# Patient Record
Sex: Female | Born: 1985 | Race: White | Hispanic: No | Marital: Married | Smoking: Never smoker
Health system: Southern US, Community
[De-identification: ages and names within clinical notes are randomized; demographics above are authoritative.]

## PROBLEM LIST (undated history)

## (undated) ENCOUNTER — Inpatient Hospital Stay (HOSPITAL_COMMUNITY): Payer: Self-pay

## (undated) DIAGNOSIS — F32A Depression, unspecified: Secondary | ICD-10-CM

## (undated) DIAGNOSIS — O2340 Unspecified infection of urinary tract in pregnancy, unspecified trimester: Secondary | ICD-10-CM

## (undated) DIAGNOSIS — F429 Obsessive-compulsive disorder, unspecified: Secondary | ICD-10-CM

## (undated) DIAGNOSIS — K5190 Ulcerative colitis, unspecified, without complications: Secondary | ICD-10-CM

## (undated) DIAGNOSIS — R053 Chronic cough: Secondary | ICD-10-CM

## (undated) DIAGNOSIS — I493 Ventricular premature depolarization: Secondary | ICD-10-CM

## (undated) DIAGNOSIS — F419 Anxiety disorder, unspecified: Secondary | ICD-10-CM

## (undated) DIAGNOSIS — I10 Essential (primary) hypertension: Secondary | ICD-10-CM

## (undated) DIAGNOSIS — F329 Major depressive disorder, single episode, unspecified: Secondary | ICD-10-CM

## (undated) DIAGNOSIS — K589 Irritable bowel syndrome without diarrhea: Secondary | ICD-10-CM

## (undated) DIAGNOSIS — M1990 Unspecified osteoarthritis, unspecified site: Secondary | ICD-10-CM

## (undated) DIAGNOSIS — R05 Cough: Secondary | ICD-10-CM

## (undated) HISTORY — PX: WISDOM TOOTH EXTRACTION: SHX21

---
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
## 2011-07-23 LAB — OB RESULTS CONSOLE PLATELET COUNT: Platelets: 269 10*3/uL

## 2011-07-23 LAB — OB RESULTS CONSOLE HIV ANTIBODY (ROUTINE TESTING): HIV: NONREACTIVE

## 2011-07-23 LAB — OB RESULTS CONSOLE HGB/HCT, BLOOD: Hemoglobin: 13.8 g/dL

## 2011-07-23 LAB — OB RESULTS CONSOLE RUBELLA ANTIBODY, IGM: Rubella: IMMUNE

## 2011-07-23 LAB — OB RESULTS CONSOLE GC/CHLAMYDIA
Chlamydia: NEGATIVE
Gonorrhea: NEGATIVE

## 2011-10-27 ENCOUNTER — Encounter (HOSPITAL_COMMUNITY): Payer: Self-pay | Admitting: *Deleted

## 2011-10-27 ENCOUNTER — Inpatient Hospital Stay (HOSPITAL_COMMUNITY)
Admission: AD | Admit: 2011-10-27 | Discharge: 2011-10-27 | Disposition: A | Discharge status: Hospice | Payer: 59 | Source: Ambulatory Visit | Attending: Obstetrics and Gynecology | Admitting: Obstetrics and Gynecology

## 2011-10-27 DIAGNOSIS — O26899 Other specified pregnancy related conditions, unspecified trimester: Secondary | ICD-10-CM

## 2011-10-27 DIAGNOSIS — R109 Unspecified abdominal pain: Secondary | ICD-10-CM | POA: Insufficient documentation

## 2011-10-27 DIAGNOSIS — O99891 Other specified diseases and conditions complicating pregnancy: Secondary | ICD-10-CM | POA: Insufficient documentation

## 2011-10-27 HISTORY — DX: Depression, unspecified: F32.A

## 2011-10-27 HISTORY — DX: Anxiety disorder, unspecified: F41.9

## 2011-10-27 HISTORY — DX: Unspecified osteoarthritis, unspecified site: M19.90

## 2011-10-27 HISTORY — DX: Unspecified infection of urinary tract in pregnancy, unspecified trimester: O23.40

## 2011-10-27 HISTORY — DX: Major depressive disorder, single episode, unspecified: F32.9

## 2011-10-27 HISTORY — DX: Irritable bowel syndrome, unspecified: K58.9

## 2011-10-27 LAB — URINALYSIS, ROUTINE W REFLEX MICROSCOPIC
Glucose, UA: NEGATIVE mg/dL
Ketones, ur: NEGATIVE mg/dL
Leukocytes, UA: NEGATIVE
pH: 7 (ref 5.0–8.0)

## 2011-10-27 NOTE — Discharge Instructions (Signed)
Abdominal Pain During Pregnancy Belly (abdominal) pain is common during pregnancy. Most of the time, it is not a serious problem. Other times, it can be a sign that something is wrong with the pregnancy. Always tell your doctor if you have belly pain. HOME CARE For mild pain:  Do not have sex (intercourse) or put anything in your vagina until you feel better.   Rest until your pain stops. If your pain lasts longer than 1 hour, call your doctor.   Drink clear fluids if you feel sick to your stomach (nauseous).   Do not eat solid food until you feel better.   Only take medicine as told by your doctor.   Keep all doctor visits as told.  GET HELP RIGHT AWAY IF:   You are bleeding, leaking fluid, or pieces of tissue come out of your vagina.   You have more pain or cramping.   You keep throwing up (vomiting).   You have pain when you pee (urinate) or have blood in your pee.   You have a fever.   You do not feel your baby moving as much.   You feel very weak or feel like passing out.   You have trouble breathing, with or without belly pain.   You have a very bad headache and belly pain.   You have fluid leaking from your vagina and belly pain.   You keep having watery poop (diarrhea).   Your belly pain does not go away after resting, or the pain gets worse.  MAKE SURE YOU:   Understand these instructions.   Will watch your condition.   Will get help right away if you are not doing well or get worse.  Document Released: 01/22/2009 Document Revised: 01/23/2011 Document Reviewed: 08/30/2010 [facility] Patient Information 2012 [place].

## 2011-10-27 NOTE — MAU Provider Note (Signed)
History     CSN: [id number]  Arrival date and time: 10/27/11 1542   First Provider Initiated Contact with Patient 10/27/11 1651      Chief Complaint  Patient presents with  . Abdominal Pain   HPI Samoria Fedorko is 26 y.o. G2P0010 59w4dweeks presenting with cramping.  States she has had cramping the entire pregnancy but now it is constant.  Patient of Dr. KMignon Pine  Points to the lower abdomen as if she is going to start her period.  Denies vaginal bleeding.  Has UTI, dx 3 days ago.  Taking antibiotic for 2 days.      Past Medical History  Diagnosis Date  . Anxiety   . Depression   . Irritable bowel syndrome (IBS)   . UTI (urinary tract infection) during pregnancy   . Arthritis     Past Surgical History  Procedure Date  . Wisdom tooth extraction     Family History  Problem Relation Age of Onset  . Hypertension Mother   . Hyperlipidemia Mother   . Arthritis Maternal Grandmother   . Hyperlipidemia Maternal Grandmother   . Hypertension Maternal Grandmother     History  Substance Use Topics  . Smoking status: Never Smoker   . Smokeless tobacco: Not on file  . Alcohol Use: Yes     prior to pregnancy    Allergies:  Allergies  Allergen Reactions  . Benadryl (Diphenhydramine Hcl) Rash    Childhood allergy  . Sulfa Antibiotics Rash    Childhood allergy    Prescriptions prior to admission  Medication Sig Dispense Refill  . acetaminophen (TYLENOL) 500 MG tablet Take 500 mg by mouth every 6 (six) hours as needed. For pain      . Docosahexaenoic Acid (DHA OMEGA 3 PO) Take 1 capsule by mouth daily.      .Marland Kitchenloperamide (IMODIUM) 2 MG capsule Take 2 mg by mouth 2 (two) times daily as needed. For diarrhea or upset stomach      . nitrofurantoin, macrocrystal-monohydrate, (MACROBID) 100 MG capsule Take 100 mg by mouth 2 (two) times daily.      . ondansetron (ZOFRAN) 8 MG tablet Take 8 mg by mouth every 8 (eight) hours as needed. For nausea or vomiting      . Prenatal Vit-Fe  Fumarate-FA (PRENATAL MULTIVITAMIN) TABS Take 1 tablet by mouth daily.      . sertraline (ZOLOFT) 50 MG tablet Take 50 mg by mouth daily.        Review of Systems  Constitutional: Negative.   HENT: Negative.   Respiratory: Negative.   Cardiovascular: Negative.   Gastrointestinal: Positive for abdominal pain (cramping).  Genitourinary: Negative.        Neg for vaginal bleeding   Physical Exam   Blood pressure 116/69, pulse 105, temperature 98.6 F (37 C), temperature source Oral, resp. rate 20, height 5' 7"  (1.702 m), weight 64.411 kg (142 lb).  Physical Exam  Constitutional: She is oriented to person, place, and time. She appears well-developed and well-nourished. No distress.  HENT:  Head: Normocephalic.  Respiratory: Effort normal.  GI: There is no tenderness.  Genitourinary: Uterus is enlarged. Uterus is not tender. No tenderness or bleeding around the vagina. No vaginal discharge found.       Cervix closed posterior.   Neurological: She is alert and oriented to person, place, and time.  Skin: Skin is warm and dry.  Psychiatric: She has a normal mood and affect. Her behavior is normal.  Results for orders placed during the hospital encounter of 10/27/11 (from the past 24 hour(s))  URINALYSIS, ROUTINE W REFLEX MICROSCOPIC     Status: Abnormal   Collection Time   10/27/11  5:30 PM      Component Value Range   Color, Urine YELLOW  YELLOW   APPearance CLEAR  CLEAR   Specific Gravity, Urine <1.005 (*) 1.005 - 1.030   pH 7.0  5.0 - 8.0   Glucose, UA NEGATIVE  NEGATIVE mg/dL   Hgb urine dipstick NEGATIVE  NEGATIVE   Bilirubin Urine NEGATIVE  NEGATIVE   Ketones, ur NEGATIVE  NEGATIVE mg/dL   Protein, ur NEGATIVE  NEGATIVE mg/dL   Urobilinogen, UA 0.2  0.0 - 1.0 mg/dL   Nitrite NEGATIVE  NEGATIVE   Leukocytes, UA NEGATIVE  NEGATIVE    MAU Course  Procedures  MDM 18:10  Reported MSE, check cervix, if closed discharge to home and use tylenol for discomfort.  Assessment  and Plan  A:  Abdominal pain at 93w4dgestation     Neg UA     Cervix is closed  P:  Reassured       May take tylenol for discomfort    Keep scheduled appointment for continued prenatal care     Call your doctor if sxs worsen  Charisma Charlot,EVE M 10/27/2011, 4:54 PM

## 2011-10-27 NOTE — MAU Note (Signed)
Has a UTI , is on medication for that-started on Fri.  Has been cramping all day, mainly low abd some in low back. cramping has gotten more frequent.

## 2012-01-01 ENCOUNTER — Observation Stay (HOSPITAL_COMMUNITY)
Admission: AD | Admit: 2012-01-01 | Discharge: 2012-01-02 | Disposition: A | Discharge status: Home / Self Care | Payer: 59 | Source: Ambulatory Visit | Attending: Obstetrics and Gynecology | Admitting: Obstetrics and Gynecology

## 2012-01-01 ENCOUNTER — Encounter (HOSPITAL_COMMUNITY): Payer: Self-pay | Admitting: *Deleted

## 2012-01-01 DIAGNOSIS — R51 Headache: Secondary | ICD-10-CM | POA: Insufficient documentation

## 2012-01-01 DIAGNOSIS — O4700 False labor before 37 completed weeks of gestation, unspecified trimester: Principal | ICD-10-CM | POA: Insufficient documentation

## 2012-01-01 DIAGNOSIS — O368390 Maternal care for abnormalities of the fetal heart rate or rhythm, unspecified trimester, not applicable or unspecified: Secondary | ICD-10-CM | POA: Insufficient documentation

## 2012-01-01 LAB — URINALYSIS, ROUTINE W REFLEX MICROSCOPIC
Leukocytes, UA: NEGATIVE
Nitrite: NEGATIVE
Specific Gravity, Urine: <1.005 — ABNORMAL LOW (ref 1.005–1.030)
pH: 6 (ref 5.0–8.0)

## 2012-01-01 LAB — AMNISURE RUPTURE OF MEMBRANE (ROM) NOT AT ARMC: Amnisure ROM: NEGATIVE

## 2012-01-01 LAB — WET PREP, GENITAL
Trich, Wet Prep: NONE SEEN
Yeast Wet Prep HPF POC: NONE SEEN

## 2012-01-01 NOTE — Progress Notes (Signed)
Pt believe's her headache was since related

## 2012-01-01 NOTE — MAU Note (Signed)
Pt states she has been having contractions "as many as 4 in 1 hour" since Saturday.

## 2012-01-01 NOTE — MAU Provider Note (Signed)
History     CSN: [id number]  Arrival date and time: 01/01/12 2123   First Provider Initiated Contact with Patient 01/01/12 2353      Chief Complaint  Patient presents with  . Contractions   HPI  Gabriela Moses is a 26 y.o. G2P0010 who presents today with frequent braxton hicks contractions. She has had them yesterday and today. She is also concerned that she may be leaking fluid.   Past Medical History  Diagnosis Date  . Anxiety   . Depression   . Irritable bowel syndrome (IBS)   . UTI (urinary tract infection) during pregnancy   . Arthritis     Past Surgical History  Procedure Date  . Wisdom tooth extraction     Family History  Problem Relation Age of Onset  . Hypertension Mother   . Hyperlipidemia Mother   . Arthritis Maternal Grandmother   . Hyperlipidemia Maternal Grandmother   . Hypertension Maternal Grandmother     History  Substance Use Topics  . Smoking status: Never Smoker   . Smokeless tobacco: Not on file  . Alcohol Use: Yes     Comment: prior to pregnancy    Allergies:  Allergies  Allergen Reactions  . Benadryl (Diphenhydramine Hcl) Rash    Childhood allergy  . Sulfa Antibiotics Rash    Childhood allergy    Prescriptions prior to admission  Medication Sig Dispense Refill  . acetaminophen (TYLENOL) 500 MG tablet Take 500 mg by mouth every 6 (six) hours as needed. For pain      . calcium carbonate (TUMS - DOSED IN MG ELEMENTAL CALCIUM) 500 MG chewable tablet Chew 2 tablets by mouth daily as needed. For heartburn      . fish oil-omega-3 fatty acids 1000 MG capsule Take 1 g by mouth daily.      . ondansetron (ZOFRAN) 8 MG tablet Take 8 mg by mouth daily as needed. For nausea or vomiting      . Prenatal Vit-Fe Fumarate-FA (PRENATAL MULTIVITAMIN) TABS Take 1 tablet by mouth daily.      . sodium chloride (OCEAN) 0.65 % nasal spray Place 1 spray into the nose daily as needed. For dryness/congestion        ROS Physical Exam   Blood pressure  131/71, pulse 105, temperature 98.5 F (36.9 C), temperature source Oral, resp. rate 18, height 5' 7"  (1.702 m), weight 69.037 kg (152 lb 3.2 oz), SpO2 100.00%.  Physical Exam  Nursing note and vitals reviewed. Constitutional: She is oriented to person, place, and time. She appears well-developed and well-nourished.  Cardiovascular: Normal rate.   Respiratory: Effort normal.  GI: Soft. She exhibits no distension. There is no tenderness.  Genitourinary:        External: normal Cervix: 1/70/0/posterior  Neurological: She is alert and oriented to person, place, and time.  Skin: Skin is warm and dry.    MAU Course  Procedures  Results for orders placed during the hospital encounter of 01/01/12 (from the past 24 hour(s))  URINALYSIS, ROUTINE W REFLEX MICROSCOPIC     Status: Abnormal   Collection Time   01/01/12  9:27 PM      Component Value Range   Color, Urine YELLOW  YELLOW   APPearance CLEAR  CLEAR   Specific Gravity, Urine <1.005 (*) 1.005 - 1.030   pH 6.0  5.0 - 8.0   Glucose, UA NEGATIVE  NEGATIVE mg/dL   Hgb urine dipstick NEGATIVE  NEGATIVE   Bilirubin Urine NEGATIVE  NEGATIVE  Ketones, ur NEGATIVE  NEGATIVE mg/dL   Protein, ur NEGATIVE  NEGATIVE mg/dL   Urobilinogen, UA 0.2  0.0 - 1.0 mg/dL   Nitrite NEGATIVE  NEGATIVE   Leukocytes, UA NEGATIVE  NEGATIVE  AMNISURE RUPTURE OF MEMBRANE (ROM)     Status: Normal   Collection Time   01/01/12 10:50 PM      Component Value Range   Amnisure ROM NEGATIVE    WET PREP, GENITAL     Status: Abnormal   Collection Time   01/01/12 10:50 PM      Component Value Range   Yeast Wet Prep HPF POC NONE SEEN  NONE SEEN   Trich, Wet Prep NONE SEEN  NONE SEEN   Clue Cells Wet Prep HPF POC FEW (*) NONE SEEN   WBC, Wet Prep HPF POC FEW (*) NONE SEEN  FETAL FIBRONECTIN     Status: Normal   Collection Time   01/01/12 10:50 PM      Component Value Range   Fetal Fibronectin NEGATIVE  NEGATIVE    *RADIOLOGY REPORT*  Clinical Data:  Variables. Nonreactive NST.  LIMITED OBSTETRIC ULTRASOUND  Number of Fetuses: 1  Heart Rate: 142 bpm  Movement: Present  Presentation: Cephalic  Placental Location: Posterior  Previa: Absent  Amniotic Fluid (Subjective): Normal  AFI: 12.1 cm (5%ile 9.5 cm, 95%ile 22.6 cm)  FL: 5.53cm 29w 2d EDC: 03/17/2012  MATERNAL FINDINGS:  Cervix: 3.8 cm, closed.  Uterus/Adnexae: Ovaries not visualized.  BPP:  Movement: 2 Time: 10 minutes  Breathing: No  Tone: No  Amniotic Fluid: No  Total Score: 8/8  IMPRESSION:  As above.  Original Report Authenticated By: Rolm Baptise, M.D.   0005Damaris Schooner with Dr. Philis Pique, reviewed patients strip, and SVE results. Plan to send patient for BPP. Start IV, bolus with LR, Procardia.  0143: Repeat SVE: 1/50/-1 0150: Spoke with Dr. Philis Pique. Plan to have patient stay on antenatal for tonight, and re-evaluate in the morning.  Assessment and Plan  Preterm contractions Admit to antenatal Procardia for tocolysis Continuous Fetal monitoring  Mathis Bud 01/01/2012, 11:54 PM

## 2012-01-01 NOTE — MAU Note (Signed)
Braxton hicks since Saturday. 1-3 contractions per hour. Denies vaginal bleeding. Positive fetal movement. Clear watery discharge "for a while" that has been checked on in the office.

## 2012-01-02 ENCOUNTER — Encounter (HOSPITAL_COMMUNITY): Payer: Self-pay | Admitting: *Deleted

## 2012-01-02 ENCOUNTER — Inpatient Hospital Stay (HOSPITAL_COMMUNITY): Payer: 59

## 2012-01-02 MED ORDER — CALCIUM CARBONATE ANTACID 500 MG PO CHEW: 2.0000 | CHEWABLE_TABLET | ORAL | Status: DC | PRN

## 2012-01-02 MED ORDER — NIFEDIPINE 10 MG PO CAPS: 10.0000 mg | ORAL_CAPSULE | Freq: Four times a day (QID) | ORAL | Status: DC

## 2012-01-02 MED ORDER — PRENATAL MULTIVITAMIN CH
1.0000 | ORAL_TABLET | Freq: Every day | ORAL | Status: DC
  Administered 2012-01-02: 1 via ORAL
  Filled 2012-01-02: qty 1

## 2012-01-02 MED ORDER — ACETAMINOPHEN 325 MG PO TABS
650.0000 mg | ORAL_TABLET | ORAL | Status: DC | PRN
  Administered 2012-01-02: 650 mg via ORAL
  Filled 2012-01-02: qty 2

## 2012-01-02 MED ORDER — NIFEDIPINE 10 MG PO CAPS: 20.0000 mg | ORAL_CAPSULE | Freq: Once | ORAL | Status: DC

## 2012-01-02 MED ORDER — LACTATED RINGERS IV BOLUS (SEPSIS)
1000.0000 mL | Freq: Once | INTRAVENOUS | Status: AC
  Administered 2012-01-02: 1000 mL via INTRAVENOUS

## 2012-01-02 MED ORDER — NIFEDIPINE 10 MG PO CAPS
20.0000 mg | ORAL_CAPSULE | Freq: Once | ORAL | Status: AC
  Administered 2012-01-02: 20 mg via ORAL
  Filled 2012-01-02: qty 1

## 2012-01-02 MED ORDER — NIFEDIPINE 10 MG PO CAPS
20.0000 mg | ORAL_CAPSULE | Freq: Once | ORAL | Status: AC
  Administered 2012-01-02: 20 mg via ORAL
  Filled 2012-01-02: qty 2

## 2012-01-02 MED ORDER — LACTATED RINGERS IV SOLN
INTRAVENOUS | Status: DC
  Administered 2012-01-02: 02:00:00 via INTRAVENOUS

## 2012-01-02 MED ORDER — NIFEDIPINE 10 MG PO CAPS
10.0000 mg | ORAL_CAPSULE | Freq: Four times a day (QID) | ORAL | Status: DC
  Administered 2012-01-02: 10 mg via ORAL
  Filled 2012-01-02: qty 1

## 2012-01-02 NOTE — Progress Notes (Signed)
UR Chart review completed.  

## 2012-01-02 NOTE — H&P (Signed)
Arrival date and time: 01/01/12 2123   First Provider Initiated Contact with Patient 01/01/12 2353      Chief Complaint  Patient presents with  . Contractions   HPI  Gabriela Moses is a 26 y.o. G2P0010 who presents today with frequent braxton hicks contractions. She has had them yesterday and today. She is also concerned that she may be leaking fluid.   Past Medical History  Diagnosis Date  . Anxiety   . Depression   . Irritable bowel syndrome (IBS)   . UTI (urinary tract infection) during pregnancy   . Arthritis     Past Surgical History  Procedure Date  . Wisdom tooth extraction     Family History  Problem Relation Age of Onset  . Hypertension Mother   . Hyperlipidemia Mother   . Arthritis Maternal Grandmother   . Hyperlipidemia Maternal Grandmother   . Hypertension Maternal Grandmother     History  Substance Use Topics  . Smoking status: Never Smoker   . Smokeless tobacco: Not on file  . Alcohol Use: Yes     Comment: prior to pregnancy    Allergies:  Allergies  Allergen Reactions  . Benadryl (Diphenhydramine Hcl) Rash    Childhood allergy  . Sulfa Antibiotics Rash    Childhood allergy    Prescriptions prior to admission  Medication Sig Dispense Refill  . acetaminophen (TYLENOL) 500 MG tablet Take 500 mg by mouth every 6 (six) hours as needed. For pain      . calcium carbonate (TUMS - DOSED IN MG ELEMENTAL CALCIUM) 500 MG chewable tablet Chew 2 tablets by mouth daily as needed. For heartburn      . fish oil-omega-3 fatty acids 1000 MG capsule Take 1 g by mouth daily.      . ondansetron (ZOFRAN) 8 MG tablet Take 8 mg by mouth daily as needed. For nausea or vomiting      . Prenatal Vit-Fe Fumarate-FA (PRENATAL MULTIVITAMIN) TABS Take 1 tablet by mouth daily.      . sodium chloride (OCEAN) 0.65 % nasal spray Place 1 spray into the nose daily as needed. For dryness/congestion        ROS Physical Exam   Blood pressure 131/71, pulse 105, temperature  98.5 F (36.9 C), temperature source Oral, resp. rate 18, height 5' 7"  (1.702 m), weight 69.037 kg (152 lb 3.2 oz), SpO2 100.00%.  Physical Exam  Nursing note and vitals reviewed. Constitutional: She is oriented to person, place, and time. She appears well-developed and well-nourished.  Cardiovascular: Normal rate.   Respiratory: Effort normal.  GI: Soft. She exhibits no distension. There is no tenderness.  Genitourinary:        External: normal Cervix: 1/70/0/posterior  Neurological: She is alert and oriented to person, place, and time.  Skin: Skin is warm and dry.    MAU Course  Procedures  Results for orders placed during the hospital encounter of 01/01/12 (from the past 24 hour(s))  URINALYSIS, ROUTINE W REFLEX MICROSCOPIC     Status: Abnormal   Collection Time   01/01/12  9:27 PM      Component Value Range   Color, Urine YELLOW  YELLOW   APPearance CLEAR  CLEAR   Specific Gravity, Urine <1.005 (*) 1.005 - 1.030   pH 6.0  5.0 - 8.0   Glucose, UA NEGATIVE  NEGATIVE mg/dL   Hgb urine dipstick NEGATIVE  NEGATIVE   Bilirubin Urine NEGATIVE  NEGATIVE   Ketones, ur NEGATIVE  NEGATIVE mg/dL  Protein, ur NEGATIVE  NEGATIVE mg/dL   Urobilinogen, UA 0.2  0.0 - 1.0 mg/dL   Nitrite NEGATIVE  NEGATIVE   Leukocytes, UA NEGATIVE  NEGATIVE  AMNISURE RUPTURE OF MEMBRANE (ROM)     Status: Normal   Collection Time   01/01/12 10:50 PM      Component Value Range   Amnisure ROM NEGATIVE    WET PREP, GENITAL     Status: Abnormal   Collection Time   01/01/12 10:50 PM      Component Value Range   Yeast Wet Prep HPF POC NONE SEEN  NONE SEEN   Trich, Wet Prep NONE SEEN  NONE SEEN   Clue Cells Wet Prep HPF POC FEW (*) NONE SEEN   WBC, Wet Prep HPF POC FEW (*) NONE SEEN  FETAL FIBRONECTIN     Status: Normal   Collection Time   01/01/12 10:50 PM      Component Value Range   Fetal Fibronectin NEGATIVE  NEGATIVE    *RADIOLOGY REPORT*  Clinical Data: Variables. Nonreactive NST.    LIMITED OBSTETRIC ULTRASOUND  Number of Fetuses: 1  Heart Rate: 142 bpm  Movement: Present  Presentation: Cephalic  Placental Location: Posterior  Previa: Absent  Amniotic Fluid (Subjective): Normal  AFI: 12.1 cm (5%ile 9.5 cm, 95%ile 22.6 cm)  FL: 5.53cm 29w 2d EDC: 03/17/2012  MATERNAL FINDINGS:  Cervix: 3.8 cm, closed.  Uterus/Adnexae: Ovaries not visualized.  BPP:  Movement: 2 Time: 10 minutes  Breathing: No  Tone: No  Amniotic Fluid: No  Total Score: 8/8  IMPRESSION:  As above.  Original Report Authenticated By: Rolm Baptise, M.D.   0005Damaris Schooner with Dr. Philis Pique, reviewed patients strip, and SVE results. Plan to send patient for BPP. Start IV, bolus with LR, Procardia.  0143: Repeat SVE: 1/50/-1 0150: Spoke with Dr. Philis Pique. Plan to have patient stay on antenatal for tonight, and re-evaluate in the morning.    A/P Preterm Contractions Non-reassuring NST Admit to antenatal Continuous monitoring Procardia

## 2012-01-02 NOTE — Progress Notes (Signed)
Bedside ultrasound in progress. From 1:05am- 1:36am. Pt returned to monitor to await results

## 2012-01-02 NOTE — H&P (Signed)
26 y.o. G2P0010 36w1dcame in for 2Tetlin  See Midwife note for full detail.  Briefly, pt was worried about leaking fluid and frequent ctxes.  The midwife ruled out ROM but felt that her cervix was shortened.  She also was found to have ctxes every 5-15 minutes with mild variables; one variable was moderate.  Good FM.  Past Medical History  Diagnosis Date  . Anxiety   . Depression   . Irritable bowel syndrome (IBS)   . UTI (urinary tract infection) during pregnancy   . Arthritis     Past Surgical History  Procedure Date  . Wisdom tooth extraction    PNC: Nml first trimester screen, AFP and ultrasound (cervix at 20 weeks was 4.3 cm.)  Pt has has frequent c/o BV and yeast.  Pt also had her 1 hr gtt which was 169 and reports her 3 hour gtt was normal.    Filed Vitals:   01/01/12 2132  BP: 131/71  Pulse: 105  Temp: 98.5 F (36.9 C)  Resp: 18    Lungs CTA Cor RRR Abd  Soft, gravid, nontender Ex SCDs FHTs  On admit 120s, good short term variability, NST R; occasional mild variables. Now 130s, gstv, NSTR and only rare less than 10 sec mild variables Toco  q 5-15   SVE still ft to1/2/-2  Results for orders placed during the hospital encounter of 01/01/12 (from the past 24 hour(s))  URINALYSIS, ROUTINE W REFLEX MICROSCOPIC     Status: Abnormal   Collection Time   01/01/12  9:27 PM      Component Value Range   Color, Urine YELLOW  YELLOW   APPearance CLEAR  CLEAR   Specific Gravity, Urine <1.005 (*) 1.005 - 1.030   pH 6.0  5.0 - 8.0   Glucose, UA NEGATIVE  NEGATIVE mg/dL   Hgb urine dipstick NEGATIVE  NEGATIVE   Bilirubin Urine NEGATIVE  NEGATIVE   Ketones, ur NEGATIVE  NEGATIVE mg/dL   Protein, ur NEGATIVE  NEGATIVE mg/dL   Urobilinogen, UA 0.2  0.0 - 1.0 mg/dL   Nitrite NEGATIVE  NEGATIVE   Leukocytes, UA NEGATIVE  NEGATIVE  AMNISURE RUPTURE OF MEMBRANE (ROM)     Status: Normal   Collection Time   01/01/12 10:50 PM   Component Value Range   Amnisure ROM NEGATIVE    WET PREP, GENITAL     Status: Abnormal   Collection Time   01/01/12 10:50 PM      Component Value Range   Yeast Wet Prep HPF POC NONE SEEN  NONE SEEN   Trich, Wet Prep NONE SEEN  NONE SEEN   Clue Cells Wet Prep HPF POC FEW (*) NONE SEEN   WBC, Wet Prep HPF POC FEW (*) NONE SEEN  FETAL FIBRONECTIN     Status: Normal   Collection Time   01/01/12 10:50 PM      Component Value Range   Fetal Fibronectin NEGATIVE  NEGATIVE    U/S AFI 12, Cervix 3.8cm, closed.  BPP 8/8  A:  HD#1  223w1dith preterm ctxes and mild variables.  P: Pt observed o/n- no change in cervix; ctxes reduced with procardia and mild variables are now rare.  D/c to home with precautions and procardia.   Alycia Cooperwood A

## 2012-01-11 NOTE — Discharge Summary (Signed)
Physician Discharge Summary  Patient ID: Gabriela Moses MRN: [id number] DOB/AGE: 26-30-1987 26 y.o.  Admit date: 01/01/2012 Discharge date: 01/11/2012  Admission Diagnoses:preterm ctxes  Discharge Diagnoses: same Active Problems:  * No active hospital problems. *    Discharged Condition: good  Hospital Course: observed overnight- no cervical change.  Consults: None  Significant Diagnostic Studies: none  Treatments: IV hydration  Discharge Exam: Blood pressure 101/47, pulse 97, temperature 98.4 F (36.9 C), temperature source Oral, resp. rate 18, height 5' 7"  (1.702 m), weight 68.947 kg (152 lb), SpO2 100.00%.   Disposition: 01-Home or Self Care  Discharge Orders    Future Orders Please Complete By Expires   Discharge instructions      Comments:   Modified bedrest.  Refrain from intercourse.  Count baby's movements in 1 hour per day- if you don't get 6 in that hour, call.   LABOR:  When conractions begin, you should start to time them from the beginning of one contraction to the beginning  of the next.  When contractions are 5 - 10 minutes apart or less and have been regular for at least an hour, you should call your health care provider.      Notify physician for bleeding from the vagina      Notify physician for pain or burning when urinating      Notify physician for chills or fever      Notify physician for increase in vaginal discharge      Notify physician for pelvic pressure (sudden increase)      Notify physician if baby moving less than usual      Notify physician for sudden, constant, or occasional abdominal pain      Notify physician for sudden gushing of fluid from the vagina (with or without continued leaking)      Notify physician for leaking of fluid      Notify physician for fainting spells, "black outs" or loss of consciousness      Notify physician for severe or continued nausea or vomiting      Notify physician for blurring of vision or spots before  the eyes      Fetal Kick Count:  Lie on our left side for one hour after a meal, and count the number of times your baby kicks.  If it is less than 5 times, get up, move around and drink some juice.  Repeat the test 30 minutes later.  If it is still less than 5 kicks in an hour, notify your doctor.      Discharge diet:  No restrictions      Do not have sex or do anything that might make you have an orgasm      Discharge activity:  No Restrictions          Medication List     As of 01/11/2012  9:10 AM    TAKE these medications         acetaminophen 500 MG tablet   Commonly known as: TYLENOL   Take 500 mg by mouth every 6 (six) hours as needed. For pain      calcium carbonate 500 MG chewable tablet   Commonly known as: TUMS - dosed in mg elemental calcium   Chew 2 tablets by mouth daily as needed. For heartburn      fish oil-omega-3 fatty acids 1000 MG capsule   Take 1 g by mouth daily.      NIFEdipine 10 MG capsule  Commonly known as: PROCARDIA   Take 1 capsule (10 mg total) by mouth every 6 (six) hours.      ondansetron 8 MG tablet   Commonly known as: ZOFRAN   Take 8 mg by mouth daily as needed. For nausea or vomiting      prenatal multivitamin Tabs   Take 1 tablet by mouth daily.      sodium chloride 0.65 % nasal spray   Commonly known as: OCEAN   Place 1 spray into the nose daily as needed. For dryness/congestion         Signed: Orvie Caradine A 01/11/2012, 9:10 AM

## 2012-01-31 ENCOUNTER — Inpatient Hospital Stay (HOSPITAL_COMMUNITY)
Admission: AD | Admit: 2012-01-31 | Discharge: 2012-01-31 | Disposition: A | Discharge status: Home / Self Care | Payer: 59 | Source: Ambulatory Visit | Attending: Obstetrics & Gynecology | Admitting: Obstetrics & Gynecology

## 2012-01-31 ENCOUNTER — Encounter (HOSPITAL_COMMUNITY): Payer: Self-pay | Admitting: Obstetrics and Gynecology

## 2012-01-31 DIAGNOSIS — O4700 False labor before 37 completed weeks of gestation, unspecified trimester: Secondary | ICD-10-CM | POA: Insufficient documentation

## 2012-01-31 DIAGNOSIS — O479 False labor, unspecified: Secondary | ICD-10-CM

## 2012-01-31 HISTORY — DX: Obsessive-compulsive disorder, unspecified: F42.9

## 2012-01-31 LAB — WET PREP, GENITAL: Trich, Wet Prep: NONE SEEN

## 2012-01-31 LAB — URINALYSIS, ROUTINE W REFLEX MICROSCOPIC
Bilirubin Urine: NEGATIVE
Glucose, UA: NEGATIVE mg/dL
Ketones, ur: NEGATIVE mg/dL
pH: 6 (ref 5.0–8.0)

## 2012-01-31 LAB — URINE MICROSCOPIC-ADD ON

## 2012-01-31 MED ORDER — ACETAMINOPHEN 500 MG PO TABS
1000.0000 mg | ORAL_TABLET | Freq: Once | ORAL | Status: DC
  Filled 2012-01-31: qty 2

## 2012-01-31 NOTE — MAU Provider Note (Signed)
History     CSN: [id number]  Arrival date and time: 01/31/12 1914   None     Chief Complaint  Patient presents with  . Contractions   HPI 26 y.o. G2P0010 at 34w2dwith contractions this morning about q 7-10 min, better now, only menstrual type cramping. No bleeding or LOF, ongoing "watery" discharge and moistness in underwear. + fetal movement. Headache on and off for the last few weeks, headache today, slight relief with Tylenol 500 mg. Also c/o chest tightness that occurs with contractions, accompanied by headache and shortness of breath. Prior episode of preterm contractions, has procardia at home, took around 11 AM, contractions improved around 2 PM, sporadic the rest of the day.   Past Medical History  Diagnosis Date  . Anxiety   . Depression   . Irritable bowel syndrome (IBS)   . UTI (urinary tract infection) during pregnancy   . Arthritis   . OCD (obsessive compulsive disorder)     Past Surgical History  Procedure Date  . Wisdom tooth extraction     Family History  Problem Relation Age of Onset  . Hypertension Mother   . Hyperlipidemia Mother   . Arthritis Maternal Grandmother   . Hyperlipidemia Maternal Grandmother   . Hypertension Maternal Grandmother     History  Substance Use Topics  . Smoking status: Never Smoker   . Smokeless tobacco: Never Used  . Alcohol Use: Yes     Comment: prior to pregnancy    Allergies:  Allergies  Allergen Reactions  . Benadryl (Diphenhydramine Hcl) Rash    Childhood allergy  . Sulfa Antibiotics Rash    Childhood allergy  . Terconazole Rash and Other (See Comments)    Severe burning sensation with rash    Prescriptions prior to admission  Medication Sig Dispense Refill  . acetaminophen (TYLENOL) 500 MG tablet Take 500 mg by mouth every 6 (six) hours as needed. For pain      . calcium carbonate (TUMS - DOSED IN MG ELEMENTAL CALCIUM) 500 MG chewable tablet Chew 2 tablets by mouth daily as needed. For heartburn      .  fish oil-omega-3 fatty acids 1000 MG capsule Take 1 g by mouth daily.      .Marland KitchenNIFEdipine (PROCARDIA) 10 MG capsule Take 1 capsule (10 mg total) by mouth every 6 (six) hours.  60 capsule  4  . Prenatal Vit-Fe Fumarate-FA (PRENATAL MULTIVITAMIN) TABS Take 1 tablet by mouth daily.      . sodium chloride (OCEAN) 0.65 % nasal spray Place 1 spray into the nose daily as needed. For dryness/congestion        Review of Systems  Constitutional: Positive for malaise/fatigue.  Eyes:       + "seeing lights"  Respiratory: Positive for cough.   Cardiovascular: Negative.   Gastrointestinal: Positive for nausea, vomiting and abdominal pain. Negative for diarrhea and constipation.  Genitourinary: Negative.   Musculoskeletal: Positive for back pain.  Neurological: Positive for weakness and headaches.   Physical Exam   Blood pressure 143/89, pulse 101, temperature 98 F (36.7 C), resp. rate 20, height 5' 7"  (1.702 m), weight 151 lb 12.8 oz (68.856 kg), SpO2 97.00%.  Filed Vitals:   01/31/12 2045 01/31/12 2101 01/31/12 2116 01/31/12 2128  BP: 119/75 116/79 113/76 129/82  Pulse: 99 107 101 96  Temp:      Resp:      Height:      Weight:      SpO2:  Physical Exam  Nursing note and vitals reviewed. Constitutional: She is oriented to person, place, and time. She appears well-developed and well-nourished. No distress.  HENT:  Head: Normocephalic and atraumatic.  Cardiovascular: Normal rate.   Respiratory: Effort normal.  GI: Soft. Bowel sounds are normal. She exhibits no mass. Distention: left lower and mid abd. There is tenderness. There is no rebound and no guarding.  Genitourinary: There is no rash or lesion on the right labia. There is no rash or lesion on the left labia. Uterus is not tender. Enlarged: Size c/w dates. Cervix exhibits no motion tenderness, no discharge and no friability. Right adnexum displays no mass, no tenderness and no fullness. Left adnexum displays no mass, no  tenderness and no fullness. No tenderness or bleeding around the vagina. No vaginal discharge found.       Dilation: 1 Effacement (%): 20 Cervical Position: Posterior Station: -3 Exam by:: Susa Simmonds CNM   Musculoskeletal: Normal range of motion.  Neurological: She is alert and oriented to person, place, and time.  Skin: Skin is warm and dry.  Psychiatric: Her mood appears anxious.   EFM reactive, TOCO quiet except for a few irregular UCs following pelvic exam MAU Course  Procedures  Results for orders placed during the hospital encounter of 01/31/12 (from the past 24 hour(s))  URINALYSIS, ROUTINE W REFLEX MICROSCOPIC     Status: Abnormal   Collection Time   01/31/12  7:36 PM      Component Value Range   Color, Urine YELLOW  YELLOW   APPearance CLEAR  CLEAR   Specific Gravity, Urine 1.020  1.005 - 1.030   pH 6.0  5.0 - 8.0   Glucose, UA NEGATIVE  NEGATIVE mg/dL   Hgb urine dipstick NEGATIVE  NEGATIVE   Bilirubin Urine NEGATIVE  NEGATIVE   Ketones, ur NEGATIVE  NEGATIVE mg/dL   Protein, ur NEGATIVE  NEGATIVE mg/dL   Urobilinogen, UA 0.2  0.0 - 1.0 mg/dL   Nitrite NEGATIVE  NEGATIVE   Leukocytes, UA TRACE (*) NEGATIVE  URINE MICROSCOPIC-ADD ON     Status: Abnormal   Collection Time   01/31/12  7:36 PM      Component Value Range   Squamous Epithelial / LPF MANY (*) RARE   WBC, UA 7-10  <3 WBC/hpf   Bacteria, UA FEW (*) RARE  WET PREP, GENITAL     Status: Abnormal   Collection Time   01/31/12  8:54 PM      Component Value Range   Yeast Wet Prep HPF POC NONE SEEN  NONE SEEN   Trich, Wet Prep NONE SEEN  NONE SEEN   Clue Cells Wet Prep HPF POC NONE SEEN  NONE SEEN   WBC, Wet Prep HPF POC MODERATE (*) NONE SEEN     Assessment and Plan   1. Braxton Hicks contractions       Medication List     As of 01/31/2012 11:06 PM    CONTINUE taking these medications         acetaminophen 500 MG tablet   Commonly known as: TYLENOL      calcium carbonate 500 MG  chewable tablet   Commonly known as: TUMS - dosed in mg elemental calcium      fish oil-omega-3 fatty acids 1000 MG capsule      NIFEdipine 10 MG capsule   Commonly known as: PROCARDIA   Take 1 capsule (10 mg total) by mouth every 6 (six) hours.  prenatal multivitamin Tabs      sodium chloride 0.65 % nasal spray   Commonly known as: OCEAN            Follow-up Information    Follow up with Sharene Butters, MD. (as scheduled or sooner as needed)    Contact information:   [place] [id number] [place] 01/31/2012, 8:14 PM

## 2012-01-31 NOTE — MAU Note (Signed)
I've had some braxton hicks ctxs which were bad this morning until about lunch. Was going to come earlier and they stopped. Sometimes have tightness in chest and now have bad h/a. Have sharp pain in abdomen which is tender. Sat down on toilet and had light show in my eyes. Took Procardia this am but did not help ctxs.

## 2012-01-31 NOTE — MAU Note (Signed)
"  I was really contracting this morning about every 7-10 minutes.  I'm not having them like I was this morning.  I am having cramping.  I have a pain on my LLQ that is dull and sometimes sharp.  It hurts more when I move around.  (+) FM.  No VB or LO.  I have a thin yellowish d/c that is like I normally have."

## 2012-02-02 LAB — URINE CULTURE: Colony Count: NO GROWTH

## 2012-02-05 ENCOUNTER — Inpatient Hospital Stay (HOSPITAL_COMMUNITY)
Admission: AD | Admit: 2012-02-05 | Discharge: 2012-02-05 | Disposition: A | Discharge status: Home / Self Care | Payer: 59 | Source: Ambulatory Visit | Attending: Obstetrics and Gynecology | Admitting: Obstetrics and Gynecology

## 2012-02-05 ENCOUNTER — Encounter (HOSPITAL_COMMUNITY): Payer: Self-pay | Admitting: *Deleted

## 2012-02-05 DIAGNOSIS — O4700 False labor before 37 completed weeks of gestation, unspecified trimester: Secondary | ICD-10-CM

## 2012-02-05 LAB — FETAL FIBRONECTIN: Fetal Fibronectin: NEGATIVE

## 2012-02-05 LAB — AMNISURE RUPTURE OF MEMBRANE (ROM) NOT AT ARMC: Amnisure ROM: NEGATIVE

## 2012-02-05 MED ORDER — NIFEDIPINE 10 MG PO CAPS
20.0000 mg | ORAL_CAPSULE | ORAL | Status: AC
  Administered 2012-02-05: 20 mg via ORAL
  Filled 2012-02-05: qty 2

## 2012-02-05 MED ORDER — ACETAMINOPHEN 325 MG PO TABS
650.0000 mg | ORAL_TABLET | ORAL | Status: AC
Start: 2012-02-05 — End: 2012-02-05
  Administered 2012-02-05: 650 mg via ORAL
  Filled 2012-02-05: qty 2

## 2012-02-05 NOTE — H&P (Signed)
26 y.o. [redacted]w[redacted]d G2P0010 comes in c/o ctxes since last night.  She has constant pressure and ctxes 2-3x/hour.  Otherwise has good fetal movement after drinking orange juice and no bleeding.    Past Medical History  Diagnosis Date  . Anxiety   . Depression   . Irritable bowel syndrome (IBS)   . UTI (urinary tract infection) during pregnancy   . Arthritis   . OCD (obsessive compulsive disorder)     Past Surgical History  Procedure Date  . Wisdom tooth extraction     OB History    Grav Para Term Preterm Abortions TAB SAB Ect Mult Living   2    1  1    0     # Outc Date GA Lbr Len/2nd Wgt Sex Del Anes PTL Lv   1 SAB 12/12           2 CUR               History   Social History  . Marital Status: Married    Spouse Name: N/A    Number of Children: N/A  . Years of Education: N/A   Occupational History  . Not on file.   Social History Main Topics  . Smoking status: Never Smoker   . Smokeless tobacco: Never Used  . Alcohol Use: Yes     Comment: prior to pregnancy  . Drug Use: No  . Sexually Active: Yes   Other Topics Concern  . Not on file   Social History Narrative  . No narrative on file   Benadryl; Sulfa antibiotics; and Terconazole    Prenatal Transfer Tool  Maternal Diabetes: No Genetic Screening: Normal Maternal Ultrasounds/Referrals: Normal Fetal Ultrasounds or other Referrals:  None Maternal Substance Abuse:  No Significant Maternal Medications:  None Significant Maternal Lab Results: None  Other PBFX:OVANVBTYvisits and frequents UTIs.    Filed Vitals:   02/05/12 1558  BP: 128/82  Pulse: 110  Temp: 98.6 F (37 C)  Resp: 18     Lungs/Cor:  NAD Abdomen:  soft, gravid Ex:  no cords, erythema SVE:  1/50/-2; vtx FHTs:  130s, good STV, NST R Toco:  none   A/P   332w0dith cervical dilation but currently no ctxes.  Await   FFN and recheck cervix.  GBS unknown.  Willine Schwalbe A

## 2012-02-05 NOTE — MAU Provider Note (Signed)
Chief Complaint:  Contractions   None     HPI: Gabriela Moses is a 26 y.o. G2P0010 at 3w0dho presents to maternity admissions from the office with cramping/contractions and 1cm dilation today.  She had decreased fetal movement this morning but is feeling movement now.  FFN collected in office.  She reports good fetal movement, denies LOF, vaginal bleeding, vaginal itching/burning, urinary symptoms, h/a, dizziness, n/v, or fever/chills.   While waiting in MAU for test results, pt reports leaking of clear fluid when she moves in bed.    Past Medical History: Past Medical History  Diagnosis Date  . Anxiety   . Depression   . Irritable bowel syndrome (IBS)   . UTI (urinary tract infection) during pregnancy   . Arthritis   . OCD (obsessive compulsive disorder)      Past Surgical History: Past Surgical History  Procedure Date  . Wisdom tooth extraction     Family History: Family History  Problem Relation Age of Onset  . Hypertension Mother   . Hyperlipidemia Mother   . Arthritis Maternal Grandmother   . Hyperlipidemia Maternal Grandmother   . Hypertension Maternal Grandmother     Social History: History  Substance Use Topics  . Smoking status: Never Smoker   . Smokeless tobacco: Never Used  . Alcohol Use: Yes     Comment: prior to pregnancy    Allergies:  Allergies  Allergen Reactions  . Benadryl (Diphenhydramine Hcl) Rash    Childhood allergy  . Sulfa Antibiotics Rash    Childhood allergy  . Terconazole Rash and Other (See Comments)    Severe burning sensation with rash    Meds:  Prescriptions prior to admission  Medication Sig Dispense Refill  . acetaminophen (TYLENOL) 500 MG tablet Take 500 mg by mouth every 6 (six) hours as needed. For pain      . calcium carbonate (TUMS - DOSED IN MG ELEMENTAL CALCIUM) 500 MG chewable tablet Chew 2 tablets by mouth daily as needed. For heartburn      . fish oil-omega-3 fatty acids 1000 MG capsule Take 1 g by mouth  daily.      .Marland KitchenNIFEdipine (PROCARDIA) 10 MG capsule Take 1 capsule (10 mg total) by mouth every 6 (six) hours.  60 capsule  4  . Prenatal Vit-Fe Fumarate-FA (PRENATAL MULTIVITAMIN) TABS Take 1 tablet by mouth daily.      . sodium chloride (OCEAN) 0.65 % nasal spray Place 1 spray into the nose daily as needed. For dryness/congestion        ROS: Pertinent findings in history of present illness.  Physical Exam  Blood pressure 128/82, pulse 110, temperature 98.6 F (37 C), temperature source Oral, resp. rate 18. GENERAL: Well-developed, well-nourished female in no acute distress.  HEENT: normocephalic HEART: normal rate RESP: normal effort ABDOMEN: Soft, non-tender, gravid appropriate for gestational age EXTREMITIES: Nontender, no edema NEURO: alert and oriented  Cervix 1/50/-3, posterior, medium consistency, vertex    FHT:  Baseline 135, moderate variability, accelerations present, no decelerations Contractions: q 10 mins with irritability in between   Labs: Results for orders placed during the hospital encounter of 02/05/12 (from the past 24 hour(s))  FETAL FIBRONECTIN     Status: Normal   Collection Time   02/05/12  4:20 PM      Component Value Range   Fetal Fibronectin NEGATIVE  NEGATIVE   Amnisure collected -negative Report to PSherre Lain MD @1800   LFatima BlankCertified Nurse-Midwife Pt not having some uterine irritability and  rare contractions, cervix unchanged, fFN and amniosure negative Discussed with Dr. Philis Pique- pt may d/c home Discharge instructions for prevention of preterm delivery, fFN, kick counts given 02/05/2012 6:06 PM

## 2012-02-05 NOTE — MAU Note (Signed)
Pt states pain became really bad last night. Pt was seen in the office and sent over for further evaluation

## 2012-02-10 ENCOUNTER — Observation Stay (HOSPITAL_COMMUNITY)
Admission: AD | Admit: 2012-02-10 | Discharge: 2012-02-11 | Disposition: A | Discharge status: Home / Self Care | Payer: 59 | Source: Ambulatory Visit | Attending: Obstetrics and Gynecology | Admitting: Obstetrics and Gynecology

## 2012-02-10 ENCOUNTER — Observation Stay (HOSPITAL_COMMUNITY): Payer: 59

## 2012-02-10 ENCOUNTER — Encounter (HOSPITAL_COMMUNITY): Payer: Self-pay | Admitting: *Deleted

## 2012-02-10 DIAGNOSIS — O9989 Other specified diseases and conditions complicating pregnancy, childbirth and the puerperium: Principal | ICD-10-CM | POA: Insufficient documentation

## 2012-02-10 DIAGNOSIS — K8020 Calculus of gallbladder without cholecystitis without obstruction: Secondary | ICD-10-CM | POA: Insufficient documentation

## 2012-02-10 DIAGNOSIS — O4700 False labor before 37 completed weeks of gestation, unspecified trimester: Secondary | ICD-10-CM | POA: Insufficient documentation

## 2012-02-10 DIAGNOSIS — L299 Pruritus, unspecified: Secondary | ICD-10-CM | POA: Insufficient documentation

## 2012-02-10 LAB — CBC WITH DIFFERENTIAL/PLATELET
Basophils Absolute: 0 10*3/uL (ref 0.0–0.1)
HCT: 36.1 % (ref 36.0–46.0)
Lymphocytes Relative: 19 % (ref 12–46)
Lymphs Abs: 2.2 10*3/uL (ref 0.7–4.0)
Neutro Abs: 8 10*3/uL — ABNORMAL HIGH (ref 1.7–7.7)
Platelets: 230 10*3/uL (ref 150–400)
RBC: 4.16 MIL/uL (ref 3.87–5.11)
RDW: 12.9 % (ref 11.5–15.5)
WBC: 11.2 10*3/uL — ABNORMAL HIGH (ref 4.0–10.5)

## 2012-02-10 LAB — COMPREHENSIVE METABOLIC PANEL
ALT: 106 U/L — ABNORMAL HIGH (ref 0–35)
AST: 54 U/L — ABNORMAL HIGH (ref 0–37)
Alkaline Phosphatase: 141 U/L — ABNORMAL HIGH (ref 39–117)
CO2: 26 mEq/L (ref 19–32)
Chloride: 100 mEq/L (ref 96–112)
GFR calc Af Amer: >90 mL/min (ref >90)
GFR calc non Af Amer: >90 mL/min (ref >90)
Glucose, Bld: 109 mg/dL — ABNORMAL HIGH (ref 70–99)
Sodium: 135 mEq/L (ref 135–145)
Total Bilirubin: 0.3 mg/dL (ref 0.3–1.2)

## 2012-02-10 MED ORDER — BETAMETHASONE SOD PHOS & ACET 6 (3-3) MG/ML IJ SUSP
12.0000 mg | Freq: Once | INTRAMUSCULAR | Status: AC
  Administered 2012-02-11: 12 mg via INTRAMUSCULAR
  Filled 2012-02-10: qty 2

## 2012-02-10 MED ORDER — BETAMETHASONE SOD PHOS & ACET 6 (3-3) MG/ML IJ SUSP
12.0000 mg | Freq: Once | INTRAMUSCULAR | Status: AC
  Administered 2012-02-10: 12 mg via INTRAMUSCULAR
  Filled 2012-02-10: qty 2

## 2012-02-10 MED ORDER — URSODIOL 300 MG PO CAPS
300.0000 mg | ORAL_CAPSULE | Freq: Two times a day (BID) | ORAL | Status: DC
  Administered 2012-02-10 – 2012-02-11 (×3): 300 mg via ORAL
  Filled 2012-02-10 (×5): qty 1

## 2012-02-10 MED ORDER — PRENATAL MULTIVITAMIN CH
1.0000 | ORAL_TABLET | Freq: Every day | ORAL | Status: DC
  Administered 2012-02-11: 1 via ORAL
  Filled 2012-02-10: qty 1

## 2012-02-10 MED ORDER — ZOLPIDEM TARTRATE 5 MG PO TABS
5.0000 mg | ORAL_TABLET | Freq: Every evening | ORAL | Status: DC | PRN
  Filled 2012-02-10: qty 1

## 2012-02-10 MED ORDER — DOCUSATE SODIUM 100 MG PO CAPS
100.0000 mg | ORAL_CAPSULE | Freq: Every day | ORAL | Status: DC
  Administered 2012-02-11: 100 mg via ORAL
  Filled 2012-02-10: qty 1

## 2012-02-10 MED ORDER — CALCIUM CARBONATE ANTACID 500 MG PO CHEW: 2.0000 | CHEWABLE_TABLET | ORAL | Status: DC | PRN

## 2012-02-10 MED ORDER — ACETAMINOPHEN 325 MG PO TABS
650.0000 mg | ORAL_TABLET | ORAL | Status: DC | PRN
  Administered 2012-02-11 (×2): 650 mg via ORAL
  Filled 2012-02-10 (×2): qty 2

## 2012-02-10 NOTE — H&P (Addendum)
Jannell Franta is a 26 y.o. female presenting for itching and elevated liver functions.  26 yo G2P0010 at 33+6 who presents for further evaluation of elevated liver functions.  The patient was seen in the office yesterday for a routine visit and complained of itching of the palms and soles of her feet.  Livere function testing and bile acids were drawn.  Bile acids are still pending but her LFTs returned today elevated with AST59/ALT 129.  Given these abnormalities she was advised to come to [facility] hospital for admission and further evaluation.    Her pregnancy has been complicated up to this point by anxiety and preterm contractions.  She has been placed on procardia for symptomatic uterine cramping.  History OB History    Grav Para Term Preterm Abortions TAB SAB Ect Mult Living   2    1  1    0     Past Medical History  Diagnosis Date  . Anxiety   . Depression   . Irritable bowel syndrome (IBS)   . UTI (urinary tract infection) during pregnancy   . Arthritis   . OCD (obsessive compulsive disorder)    Past Surgical History  Procedure Date  . Wisdom tooth extraction    Family History: family history includes Arthritis in her maternal grandmother; Hyperlipidemia in her maternal grandmother and mother; and Hypertension in her maternal grandmother and mother. Social History:  reports that she has never smoked. She has never used smokeless tobacco. She reports that she drinks alcohol. She reports that she does not use illicit drugs.   Prenatal Transfer Tool  Maternal Diabetes: No, Elevated 1hr gtt, 3hr [phone] WNL Genetic Screening: Normal Maternal Ultrasounds/Referrals: Normal Fetal Ultrasounds or other Referrals:  None Maternal Substance Abuse:  No Significant Maternal Medications:  None Significant Maternal Lab Results:  Lab values include: Other: Elevated LFTs Other Comments:  None  ROS: as above    Blood pressure 129/81, pulse 93, temperature 97.9 F (36.6 C),  temperature source Oral, resp. rate 20, height 5' 7"  (1.702 m), weight 70.308 kg (155 lb). Exam Physical Exam  CVX: 1/50/-2 FHT 130 reactive toco irritability with ocassional contractions Prenatal labs: ABO, Rh: O/Positive/-- (06/05 0000) Antibody: Negative (06/05 0000) Rubella: Immune (06/05 0000) RPR: Nonreactive (06/05 0000)  HBsAg: Negative (06/05 0000)  HIV: Non-reactive (06/05 0000)  GBS:   pending  Assessment/Plan: 1) Admit 2) Given pruritis and elevated LFTs pt likely has cholestasis of pregnancy.  Bile acids drawn in office are pending. Given elevated LFTs will do a pre-eclampsia evaluation.  Will do a 24 hr urine.  Start ursodiol 3) Antenatal steroids to enhance FLM 4) U/S for EFW/AFI 5) MFM consultation unavailable at this time.  Will obtain labs and ultrasound.  Discuss case with MFM by phone once these things have been obtained 6) GBS culture  Sadat Sliwa H. 02/10/2012, 2:34 PM

## 2012-02-10 NOTE — Progress Notes (Signed)
MD at bedside discussing with pt POC and evaluation.

## 2012-02-11 LAB — PROTEIN, URINE, 24 HOUR
Protein, 24H Urine: 78 mg/d (ref 50–100)
Urine Total Volume-UPROT: 2600 mL

## 2012-02-11 LAB — CREATININE CLEARANCE, URINE, 24 HOUR
Creatinine, Urine: 41.93 mg/dL
Creatinine: 0.54 mg/dL (ref 0.50–1.10)
Urine Total Volume-CRCL: 2600 mL

## 2012-02-11 LAB — PROTEIN / CREATININE RATIO, URINE: Protein Creatinine Ratio: 0.14 (ref 0.00–0.15)

## 2012-02-11 MED ORDER — NIFEDIPINE 10 MG PO CAPS
10.0000 mg | ORAL_CAPSULE | Freq: Once | ORAL | Status: AC
  Administered 2012-02-11: 10 mg via ORAL
  Filled 2012-02-11: qty 1

## 2012-02-11 MED ORDER — URSODIOL 300 MG PO CAPS: 300.0000 mg | ORAL_CAPSULE | Freq: Two times a day (BID) | ORAL | Status: DC

## 2012-02-11 NOTE — Discharge Summary (Signed)
Physician Discharge Summary  Patient ID: Gabriela Moses MRN: [id number] DOB/AGE: [birth year] 26 y.o.  Admit date: 02/10/2012 Discharge date: 02/11/2012  Admission Diagnoses: Elevated LFT's, r/o HEELP  Discharge Diagnoses: Cholestasis of pregnancy Active Problems:  * No active hospital problems. *    Discharged Condition: Stable  Hospital Course: Patient admitted for hospital observation after out patient labs drawn to evaluate generalized pruritis revealed mild elevation of LFTs.  Evaluation included serial blood pressures (which were all normal), repeat LFT's which were slight improved from those drawn 24 hours before (SGOT in the 50's and SGPT in the 120's), normal platelet counts, Reactive 24 hour fetal heart rate tracing, Symmetrically grown fetus (75th%ile), AFI >10.  Patient received Betamethasone course.  Cervical exam was 1/50 (unchanged) and ultrasound measurement of the cervical length was >2cm.  She was placed on Ursodiol for cholestasis.  24 hour urine pending.  Spot urine protein/creatinine ratio was normal.   Discharge Exam: Blood pressure 122/73, pulse 100, temperature 98.4 F (36.9 C), temperature source Oral, resp. rate 20, height 5' 7"  (1.702 m), weight 155 lb (70.308 kg). Cervix 1/50 and posterior.  Vertex in lower segment.  Disposition: 01-Home or Self Care Return to office in one week.     Medication List     As of 02/11/2012  2:52 PM    TAKE these medications         acetaminophen 500 MG tablet   Commonly known as: TYLENOL   Take 500 mg by mouth every 6 (six) hours as needed. For pain      fish oil-omega-3 fatty acids 1000 MG capsule   Take 1 g by mouth daily.      NIFEdipine 10 MG capsule   Commonly known as: PROCARDIA   Take 1 capsule (10 mg total) by mouth every 6 (six) hours.      prenatal multivitamin Tabs   Take 1 tablet by mouth daily.      sodium chloride 0.65 % nasal spray   Commonly known as: OCEAN   Place 1 spray into the nose daily as  needed. For dryness/congestion      ursodiol 300 MG capsule   Commonly known as: ACTIGALL   Take 1 capsule (300 mg total) by mouth 2 (two) times daily.         SignedAlden Hipp D 02/11/2012, 2:52 PM

## 2012-02-18 NOTE — L&D Delivery Note (Signed)
Patient was C/C/+2 and pushed for 80 minutes with epidural.    Outlet forceps offered secondary pt rectal discomfort in pushing-SVD  female infant, Apgars 8,9, weight P.   The patient had one midline episiotomy repaired with 2-0 vicryl R. Fundus was firm. EBL was expected. Placenta was delivered intact. Vagina was clear.  Baby was vigorous to bedside.  Satchel Heidinger A

## 2012-02-21 ENCOUNTER — Encounter (HOSPITAL_COMMUNITY): Payer: Self-pay | Admitting: *Deleted

## 2012-02-21 ENCOUNTER — Inpatient Hospital Stay (HOSPITAL_COMMUNITY): Payer: 59

## 2012-02-21 ENCOUNTER — Inpatient Hospital Stay (HOSPITAL_COMMUNITY)
Admission: AD | Admit: 2012-02-21 | Discharge: 2012-02-21 | Disposition: A | Discharge status: Home / Self Care | Payer: 59 | Source: Ambulatory Visit | Attending: Obstetrics and Gynecology | Admitting: Obstetrics and Gynecology

## 2012-02-21 DIAGNOSIS — O2390 Unspecified genitourinary tract infection in pregnancy, unspecified trimester: Secondary | ICD-10-CM | POA: Insufficient documentation

## 2012-02-21 DIAGNOSIS — N760 Acute vaginitis: Secondary | ICD-10-CM

## 2012-02-21 DIAGNOSIS — B9689 Other specified bacterial agents as the cause of diseases classified elsewhere: Secondary | ICD-10-CM

## 2012-02-21 DIAGNOSIS — A499 Bacterial infection, unspecified: Secondary | ICD-10-CM | POA: Insufficient documentation

## 2012-02-21 DIAGNOSIS — N949 Unspecified condition associated with female genital organs and menstrual cycle: Secondary | ICD-10-CM | POA: Insufficient documentation

## 2012-02-21 LAB — WET PREP, GENITAL

## 2012-02-21 LAB — AMNISURE RUPTURE OF MEMBRANE (ROM) NOT AT ARMC: Amnisure ROM: NEGATIVE

## 2012-02-21 LAB — URINALYSIS, ROUTINE W REFLEX MICROSCOPIC
Bilirubin Urine: NEGATIVE
Glucose, UA: NEGATIVE mg/dL
Hgb urine dipstick: NEGATIVE
Ketones, ur: NEGATIVE mg/dL
Nitrite: NEGATIVE
pH: 6 (ref 5.0–8.0)

## 2012-02-21 MED ORDER — METRONIDAZOLE 500 MG PO TABS: 500.0000 mg | ORAL_TABLET | Freq: Three times a day (TID) | ORAL | Status: DC

## 2012-02-21 NOTE — MAU Note (Signed)
Patient states she is having watery discharge with itching and burning

## 2012-02-21 NOTE — MAU Provider Note (Signed)
History     CSN: [id number]  Arrival date and time: 02/21/12 8469   First Provider Initiated Contact with Patient 02/21/12 2000      Chief Complaint  Patient presents with  . Vaginal Discharge   HPI  Gabriela Moses is a 27 y.o. G2P0010 at 69w2dwho presents today with concern for ROM. She states that earlier today she was vomiting and dry heaving when she felt a gush of fluid. She continued to have some leaking throughout the day. She denies any UCs or Vaginal bleeding. +FM.   Past Medical History  Diagnosis Date  . Anxiety   . Depression   . Irritable bowel syndrome (IBS)   . UTI (urinary tract infection) during pregnancy   . Arthritis   . OCD (obsessive compulsive disorder)     Past Surgical History  Procedure Date  . Wisdom tooth extraction     Family History  Problem Relation Age of Onset  . Hypertension Mother   . Hyperlipidemia Mother   . Arthritis Maternal Grandmother   . Hyperlipidemia Maternal Grandmother   . Hypertension Maternal Grandmother     History  Substance Use Topics  . Smoking status: Never Smoker   . Smokeless tobacco: Never Used  . Alcohol Use: Yes     Comment: prior to pregnancy    Allergies:  Allergies  Allergen Reactions  . Benadryl (Diphenhydramine Hcl) Rash    Childhood allergy  . Sulfa Antibiotics Rash    Childhood allergy  . Terconazole Rash and Other (See Comments)    Severe burning sensation with rash    Prescriptions prior to admission  Medication Sig Dispense Refill  . acetaminophen (TYLENOL) 500 MG tablet Take 500 mg by mouth every 6 (six) hours as needed. For pain      . fish oil-omega-3 fatty acids 1000 MG capsule Take 1 g by mouth daily.      .Marland KitchenNIFEdipine (PROCARDIA) 10 MG capsule Take 1 capsule (10 mg total) by mouth every 6 (six) hours.  60 capsule  4  . Prenatal Vit-Fe Fumarate-FA (PRENATAL MULTIVITAMIN) TABS Take 1 tablet by mouth daily.      . sodium chloride (OCEAN) 0.65 % nasal spray Place 1 spray into the  nose daily as needed. For dryness/congestion      . ursodiol (ACTIGALL) 300 MG capsule Take 1 capsule (300 mg total) by mouth 2 (two) times daily.  60 capsule  3    Review of Systems  Constitutional: Negative for fever and chills.  Eyes: Negative for blurred vision.  Gastrointestinal: Positive for nausea and vomiting. Negative for abdominal pain, diarrhea and constipation.  Genitourinary: Negative for dysuria, urgency and frequency.  Musculoskeletal: Negative for myalgias.  Neurological: Negative for dizziness and headaches.   Physical Exam   Blood pressure 128/82, pulse 96, temperature 97.1 F (36.2 C), temperature source Oral, resp. rate 18.  Physical Exam  Nursing note and vitals reviewed. Constitutional: She is oriented to person, place, and time. She appears well-developed and well-nourished. No distress.  Cardiovascular: Normal rate.   Respiratory: Effort normal.  GI: Soft.  Genitourinary:        External: no lesions Vagina: small amount of white discharge seen. No pooling or fluid. Cervix: visually closed. No fluid seen from os with valsalva 1/50/-1 Uterus: Gravid, AGA FHT 135, moderate with 15X15 accels, no decels Toco: No UCs.     Neurological: She is alert and oriented to person, place, and time.  Skin: Skin is warm and dry.  Psychiatric: She has a normal mood and affect.    MAU Course  Procedures  Results for orders placed during the hospital encounter of 02/21/12 (from the past 24 hour(s))  URINALYSIS, ROUTINE W REFLEX MICROSCOPIC     Status: Abnormal   Collection Time   02/21/12  7:30 PM      Component Value Range   Color, Urine YELLOW  YELLOW   APPearance CLEAR  CLEAR   Specific Gravity, Urine 1.010  1.005 - 1.030   pH 6.0  5.0 - 8.0   Glucose, UA NEGATIVE  NEGATIVE mg/dL   Hgb urine dipstick NEGATIVE  NEGATIVE   Bilirubin Urine NEGATIVE  NEGATIVE   Ketones, ur NEGATIVE  NEGATIVE mg/dL   Protein, ur NEGATIVE  NEGATIVE mg/dL   Urobilinogen, UA 0.2   0.0 - 1.0 mg/dL   Nitrite NEGATIVE  NEGATIVE   Leukocytes, UA SMALL (*) NEGATIVE  URINE MICROSCOPIC-ADD ON     Status: Normal   Collection Time   02/21/12  7:30 PM      Component Value Range   Squamous Epithelial / LPF RARE  RARE   WBC, UA 0-2  <3 WBC/hpf   RBC / HPF 0-2  <3 RBC/hpf   Urine-Other MUCOUS PRESENT    WET PREP, GENITAL     Status: Abnormal   Collection Time   02/21/12  8:10 PM      Component Value Range   Yeast Wet Prep HPF POC NONE SEEN  NONE SEEN   Trich, Wet Prep NONE SEEN  NONE SEEN   Clue Cells Wet Prep HPF POC FEW (*) NONE SEEN   WBC, Wet Prep HPF POC MODERATE (*) NONE SEEN  AMNISURE RUPTURE OF MEMBRANE (ROM)     Status: Normal   Collection Time   02/21/12  8:12 PM      Component Value Range   Amnisure ROM NEGATIVE      *RADIOLOGY REPORT*  Clinical Data: Pregnant, leaking fluid, assess amniotic fluid  volume  LIMITED OBSTETRIC ULTRASOUND  BIOPHYSICAL PROFILE  Number of Fetuses: 1  Heart Rate: 130bpm  Movement: Present  Presentation: Cephalic  Placental Location: Posterior  Previa: None  Amniotic Fluid (subjective): Normal  Vertical pocket: 5.02cm AFI: 14.12cm (5%ile 8.1 cm, 95%ile  24.8 cm)  BPD: 8.9cm 36w 1d  MATERNAL FINDINGS:  Cervix: Not assessed, greater than 34 weeks  Uterus/Adnexae: Normal appearing ovaries. Uterus unremarkable.  BPP:  Movement: 2 Time: 30 min  Breathing: 0  Tone: 2  Amniotic Fluid: 2  Total Score: 6  Impression:  Single live intrauterine gestation, cephalic presentation with  calculated AFI 14.12 cm.  Fetal BPP 6/8, with breathing not identified during the evaluation  period.  Recommend followup with non-emergent complete OB 14+ wk US  examination for fetal biometric evaluation and anatomic survey if  not already performed.  Original Report Authenticated By: Lavonia Dana, M.D.   BPP= 8/10 with reactive NST  2057: Spoke with Dr. Philis Pique. Order for BPP/AFI, patient can be dc home if those are normal.  Assessment and  Plan   1. BV (bacterial vaginosis)    Reassuing antenatal testing BV, rx flagyl 500 mg 1 po BID X 7  FU with Primary provider as scheduled.   Mathis Bud 02/21/2012, 8:11 PM

## 2012-02-22 LAB — GC/CHLAMYDIA PROBE AMP
CT Probe RNA: NEGATIVE
GC Probe RNA: NEGATIVE

## 2012-03-10 ENCOUNTER — Inpatient Hospital Stay (HOSPITAL_COMMUNITY)
Admission: RE | Admit: 2012-03-10 | Discharge: 2012-03-13 | DRG: 775 | Disposition: A | Discharge status: Home / Self Care | Payer: 59 | Source: Ambulatory Visit | Attending: Obstetrics and Gynecology | Admitting: Obstetrics and Gynecology

## 2012-03-10 DIAGNOSIS — F429 Obsessive-compulsive disorder, unspecified: Secondary | ICD-10-CM | POA: Diagnosis present

## 2012-03-10 DIAGNOSIS — K838 Other specified diseases of biliary tract: Secondary | ICD-10-CM | POA: Diagnosis present

## 2012-03-10 DIAGNOSIS — O99344 Other mental disorders complicating childbirth: Secondary | ICD-10-CM | POA: Diagnosis present

## 2012-03-10 DIAGNOSIS — O26619 Liver and biliary tract disorders in pregnancy, unspecified trimester: Principal | ICD-10-CM | POA: Diagnosis present

## 2012-03-10 LAB — CBC
Hemoglobin: 12.3 g/dL (ref 12.0–15.0)
MCH: 28.6 pg (ref 26.0–34.0)
MCHC: 33.9 g/dL (ref 30.0–36.0)
MCV: 84.4 fL (ref 78.0–100.0)
Platelets: 205 10*3/uL (ref 150–400)

## 2012-03-10 LAB — COMPREHENSIVE METABOLIC PANEL
ALT: 17 U/L (ref 0–35)
AST: 26 U/L (ref 0–37)
Alkaline Phosphatase: 173 U/L — ABNORMAL HIGH (ref 39–117)
CO2: 20 mEq/L (ref 19–32)
Calcium: 9 mg/dL (ref 8.4–10.5)
GFR calc non Af Amer: >90 mL/min (ref >90)
Potassium: 4.2 mEq/L (ref 3.5–5.1)
Sodium: 133 mEq/L — ABNORMAL LOW (ref 135–145)

## 2012-03-10 LAB — TYPE AND SCREEN

## 2012-03-10 MED ORDER — LORAZEPAM 2 MG/ML IJ SOLN: 1.0000 mg | INTRAMUSCULAR | Status: DC | PRN

## 2012-03-10 MED ORDER — OXYCODONE-ACETAMINOPHEN 5-325 MG PO TABS: 1.0000 | ORAL_TABLET | ORAL | Status: DC | PRN

## 2012-03-10 MED ORDER — ONDANSETRON HCL 4 MG/2ML IJ SOLN: 4.0000 mg | Freq: Four times a day (QID) | INTRAMUSCULAR | Status: DC | PRN

## 2012-03-10 MED ORDER — LACTATED RINGERS IV SOLN
INTRAVENOUS | Status: DC
  Administered 2012-03-10 – 2012-03-11 (×3): via INTRAVENOUS

## 2012-03-10 MED ORDER — OXYTOCIN 40 UNITS IN LACTATED RINGERS INFUSION - SIMPLE MED
62.5000 mL/h | INTRAVENOUS | Status: DC
  Filled 2012-03-10: qty 1000

## 2012-03-10 MED ORDER — EPHEDRINE 5 MG/ML INJ
10.0000 mg | INTRAVENOUS | Status: DC | PRN
  Filled 2012-03-10: qty 4

## 2012-03-10 MED ORDER — HYDROXYZINE HCL 50 MG/ML IM SOLN: 50.0000 mg | Freq: Four times a day (QID) | INTRAMUSCULAR | Status: DC | PRN

## 2012-03-10 MED ORDER — FLEET ENEMA 7-19 GM/118ML RE ENEM: 1.0000 | ENEMA | RECTAL | Status: DC | PRN

## 2012-03-10 MED ORDER — HYDROXYZINE HCL 50 MG PO TABS: 50.0000 mg | ORAL_TABLET | Freq: Four times a day (QID) | ORAL | Status: DC | PRN

## 2012-03-10 MED ORDER — ACETAMINOPHEN 325 MG PO TABS: 650.0000 mg | ORAL_TABLET | ORAL | Status: DC | PRN

## 2012-03-10 MED ORDER — CITRIC ACID-SODIUM CITRATE 334-500 MG/5ML PO SOLN: 30.0000 mL | ORAL | Status: DC | PRN

## 2012-03-10 MED ORDER — BUTORPHANOL TARTRATE 1 MG/ML IJ SOLN
1.0000 mg | INTRAMUSCULAR | Status: DC | PRN
  Administered 2012-03-11 (×2): 1 mg via INTRAVENOUS
  Filled 2012-03-10 (×2): qty 1

## 2012-03-10 MED ORDER — LACTATED RINGERS IV SOLN: 500.0000 mL | Freq: Once | INTRAVENOUS | Status: DC

## 2012-03-10 MED ORDER — TERBUTALINE SULFATE 1 MG/ML IJ SOLN: 0.2500 mg | Freq: Once | INTRAMUSCULAR | Status: AC | PRN

## 2012-03-10 MED ORDER — OXYTOCIN BOLUS FROM INFUSION: 500.0000 mL | INTRAVENOUS | Status: DC

## 2012-03-10 MED ORDER — IBUPROFEN 600 MG PO TABS: 600.0000 mg | ORAL_TABLET | Freq: Four times a day (QID) | ORAL | Status: DC | PRN

## 2012-03-10 MED ORDER — LACTATED RINGERS IV SOLN: 500.0000 mL | INTRAVENOUS | Status: DC | PRN

## 2012-03-10 MED ORDER — MISOPROSTOL 25 MCG QUARTER TABLET
25.0000 ug | ORAL_TABLET | ORAL | Status: DC | PRN
  Administered 2012-03-10 – 2012-03-11 (×2): 25 ug via VAGINAL
  Filled 2012-03-10 (×2): qty 0.25

## 2012-03-10 MED ORDER — PHENYLEPHRINE 40 MCG/ML (10ML) SYRINGE FOR IV PUSH (FOR BLOOD PRESSURE SUPPORT): 80.0000 ug | PREFILLED_SYRINGE | INTRAVENOUS | Status: DC | PRN

## 2012-03-10 MED ORDER — PHENYLEPHRINE 40 MCG/ML (10ML) SYRINGE FOR IV PUSH (FOR BLOOD PRESSURE SUPPORT)
80.0000 ug | PREFILLED_SYRINGE | INTRAVENOUS | Status: DC | PRN
  Filled 2012-03-10: qty 5

## 2012-03-10 MED ORDER — LIDOCAINE HCL (PF) 1 % IJ SOLN
30.0000 mL | INTRAMUSCULAR | Status: DC | PRN
  Filled 2012-03-10: qty 30

## 2012-03-10 MED ORDER — LIDOCAINE HCL (PF) 1 % IJ SOLN: 30.0000 mL | INTRAMUSCULAR | Status: DC | PRN

## 2012-03-10 MED ORDER — OXYTOCIN 40 UNITS IN LACTATED RINGERS INFUSION - SIMPLE MED: 62.5000 mL/h | INTRAVENOUS | Status: DC

## 2012-03-10 MED ORDER — EPHEDRINE 5 MG/ML INJ: 10.0000 mg | INTRAVENOUS | Status: DC | PRN

## 2012-03-10 MED ORDER — FENTANYL 2.5 MCG/ML BUPIVACAINE 1/10 % EPIDURAL INFUSION (WH - ANES)
14.0000 mL/h | INTRAMUSCULAR | Status: DC
  Administered 2012-03-11: 14 mL/h via EPIDURAL
  Filled 2012-03-10: qty 125

## 2012-03-10 MED ORDER — ZOLPIDEM TARTRATE 5 MG PO TABS: 5.0000 mg | ORAL_TABLET | Freq: Every evening | ORAL | Status: DC | PRN

## 2012-03-10 MED ORDER — LACTATED RINGERS IV SOLN
INTRAVENOUS | Status: DC
  Administered 2012-03-10: 21:00:00 via INTRAVENOUS

## 2012-03-10 NOTE — H&P (Signed)
Gabriela Moses is a 27 y.o. female presenting for IOL for cholestasis of pregnancy  26 yo G2P0010 @ 36+6 weeks who presents for induction of labor for cholestasis of pregnancy. The patient began experiencing systemic itching at 32-33 weeks.  Bile acids and LFTs were check and her LFTs were noted to be elevated with  ALT 129 and AST 59.  The patient was admitted to antenatal at 33 weeks for a pre-eclampsia evaluation.  24 hr urine was WNL. She was started on ursodiol and discharged home.  Bile acids ultimately returned 9.  She has been followed twice weekly with NSTs.  Since institution of ursodiol her LFTs have normalized and bile acids have remained 9. The patient continues to have itching.  The patients case was discussed with Dr. Lisbeth Renshaw of MFM at length.  Given previously elevated LFTs and persistent itching he agreed with the decision to proceed with induction of labor for cholestasis of pregnancy.  History OB History    Grav Para Term Preterm Abortions TAB SAB Ect Mult Living   2    1  1    0     Past Medical History  Diagnosis Date  . Anxiety   . Depression   . Irritable bowel syndrome (IBS)   . UTI (urinary tract infection) during pregnancy   . Arthritis   . OCD (obsessive compulsive disorder)    Past Surgical History  Procedure Date  . Wisdom tooth extraction    Family History: family history includes Arthritis in her maternal grandmother; Hyperlipidemia in her maternal grandmother and mother; and Hypertension in her maternal grandmother and mother. Social History:  reports that she has never smoked. She has never used smokeless tobacco. She reports that she drinks alcohol. She reports that she does not use illicit drugs.   Prenatal Transfer Tool  Maternal Diabetes: No, Elevated 1hr gtt, 3hr WNL Genetic Screening: Normal Maternal Ultrasounds/Referrals: Normal Fetal Ultrasounds or other Referrals:  None Maternal Substance Abuse:  No Significant Maternal Medications:  Meds  include: Other:  Ursodiol Significant Maternal Lab Results:  None, except as above Other Comments:  None  ROS: as above    There were no vitals taken for this visit. Exam Physical Exam  Prenatal labs: ABO, Rh: O/Positive/-- (06/05 0000) Antibody: Negative (06/05 0000) Rubella: Immune (06/05 0000) RPR: Nonreactive (06/05 0000)  HBsAg: Negative (06/05 0000)  HIV: Non-reactive (06/05 0000)  GBS: Negative (12/27 0000)   Assessment/Plan: 1) Admit 2) Misoprostal 110mg PV Q4 hrs 3) Epidural on request  Cedra Villalon H. 03/10/2012, 8:27 PM

## 2012-03-11 ENCOUNTER — Encounter (HOSPITAL_COMMUNITY): Payer: Self-pay | Admitting: Anesthesiology

## 2012-03-11 ENCOUNTER — Inpatient Hospital Stay (HOSPITAL_COMMUNITY): Payer: 59 | Admitting: Anesthesiology

## 2012-03-11 ENCOUNTER — Encounter (HOSPITAL_COMMUNITY): Payer: Self-pay

## 2012-03-11 LAB — RPR: RPR Ser Ql: NONREACTIVE

## 2012-03-11 MED ORDER — SODIUM CHLORIDE 0.9 % IV SOLN: 250.0000 mL | INTRAVENOUS | Status: DC | PRN

## 2012-03-11 MED ORDER — WITCH HAZEL-GLYCERIN EX PADS
1.0000 "application " | MEDICATED_PAD | CUTANEOUS | Status: DC | PRN
  Administered 2012-03-11: 1 via TOPICAL

## 2012-03-11 MED ORDER — MAGNESIUM HYDROXIDE 400 MG/5ML PO SUSP: 30.0000 mL | ORAL | Status: DC | PRN

## 2012-03-11 MED ORDER — METHYLERGONOVINE MALEATE 0.2 MG PO TABS: 0.2000 mg | ORAL_TABLET | ORAL | Status: DC | PRN

## 2012-03-11 MED ORDER — METHYLERGONOVINE MALEATE 0.2 MG/ML IJ SOLN: 0.2000 mg | INTRAMUSCULAR | Status: DC | PRN

## 2012-03-11 MED ORDER — SENNOSIDES-DOCUSATE SODIUM 8.6-50 MG PO TABS
2.0000 | ORAL_TABLET | Freq: Every day | ORAL | Status: DC
  Administered 2012-03-11 – 2012-03-12 (×2): 2 via ORAL

## 2012-03-11 MED ORDER — TETANUS-DIPHTH-ACELL PERTUSSIS 5-2.5-18.5 LF-MCG/0.5 IM SUSP
0.5000 mL | Freq: Once | INTRAMUSCULAR | Status: AC
  Administered 2012-03-12: 0.5 mL via INTRAMUSCULAR
  Filled 2012-03-11: qty 0.5

## 2012-03-11 MED ORDER — MEASLES, MUMPS & RUBELLA VAC ~~LOC~~ INJ: 0.5000 mL | INJECTION | Freq: Once | SUBCUTANEOUS | Status: DC

## 2012-03-11 MED ORDER — URSODIOL 300 MG PO CAPS
300.0000 mg | ORAL_CAPSULE | Freq: Two times a day (BID) | ORAL | Status: DC
  Administered 2012-03-11 – 2012-03-13 (×4): 300 mg via ORAL
  Filled 2012-03-11 (×4): qty 1

## 2012-03-11 MED ORDER — BENZOCAINE-MENTHOL 20-0.5 % EX AERO
1.0000 "application " | INHALATION_SPRAY | CUTANEOUS | Status: DC | PRN
  Administered 2012-03-11: 1 via TOPICAL
  Filled 2012-03-11: qty 56

## 2012-03-11 MED ORDER — PRENATAL MULTIVITAMIN CH
1.0000 | ORAL_TABLET | Freq: Every day | ORAL | Status: DC
  Administered 2012-03-11 – 2012-03-13 (×3): 1 via ORAL
  Filled 2012-03-11 (×3): qty 1

## 2012-03-11 MED ORDER — SIMETHICONE 80 MG PO CHEW
80.0000 mg | CHEWABLE_TABLET | ORAL | Status: DC | PRN
  Administered 2012-03-11: 80 mg via ORAL

## 2012-03-11 MED ORDER — ONDANSETRON HCL 4 MG/2ML IJ SOLN: 4.0000 mg | INTRAMUSCULAR | Status: DC | PRN

## 2012-03-11 MED ORDER — DIBUCAINE 1 % RE OINT: 1.0000 "application " | TOPICAL_OINTMENT | RECTAL | Status: DC | PRN

## 2012-03-11 MED ORDER — LIDOCAINE HCL (PF) 1 % IJ SOLN
INTRAMUSCULAR | Status: DC | PRN
  Administered 2012-03-11 (×2): 5 mL

## 2012-03-11 MED ORDER — ONDANSETRON HCL 4 MG PO TABS: 4.0000 mg | ORAL_TABLET | ORAL | Status: DC | PRN

## 2012-03-11 MED ORDER — FERROUS SULFATE 325 (65 FE) MG PO TABS
325.0000 mg | ORAL_TABLET | Freq: Two times a day (BID) | ORAL | Status: DC
  Administered 2012-03-12 – 2012-03-13 (×3): 325 mg via ORAL
  Filled 2012-03-11 (×3): qty 1

## 2012-03-11 MED ORDER — IBUPROFEN 800 MG PO TABS
800.0000 mg | ORAL_TABLET | Freq: Three times a day (TID) | ORAL | Status: DC
  Administered 2012-03-11 – 2012-03-13 (×5): 800 mg via ORAL
  Filled 2012-03-11 (×5): qty 1

## 2012-03-11 MED ORDER — SODIUM CHLORIDE 0.9 % IJ SOLN: 3.0000 mL | INTRAMUSCULAR | Status: DC | PRN

## 2012-03-11 MED ORDER — LANOLIN HYDROUS EX OINT: TOPICAL_OINTMENT | CUTANEOUS | Status: DC | PRN

## 2012-03-11 MED ORDER — OXYCODONE-ACETAMINOPHEN 5-325 MG PO TABS
1.0000 | ORAL_TABLET | ORAL | Status: DC | PRN
  Administered 2012-03-12 (×2): 1 via ORAL
  Filled 2012-03-11 (×2): qty 1

## 2012-03-11 MED ORDER — URSODIOL 300 MG PO CAPS
300.0000 mg | ORAL_CAPSULE | Freq: Two times a day (BID) | ORAL | Status: DC
  Administered 2012-03-11 (×2): 300 mg via ORAL
  Filled 2012-03-11 (×4): qty 1

## 2012-03-11 MED ORDER — SODIUM CHLORIDE 0.9 % IJ SOLN: 3.0000 mL | Freq: Two times a day (BID) | INTRAMUSCULAR | Status: DC

## 2012-03-11 MED ORDER — ZOLPIDEM TARTRATE 5 MG PO TABS: 5.0000 mg | ORAL_TABLET | Freq: Every evening | ORAL | Status: DC | PRN

## 2012-03-11 MED ORDER — DIPHENHYDRAMINE HCL 25 MG PO CAPS: 25.0000 mg | ORAL_CAPSULE | Freq: Four times a day (QID) | ORAL | Status: DC | PRN

## 2012-03-11 NOTE — Anesthesia Preprocedure Evaluation (Signed)
Anesthesia Evaluation  Patient identified by MRN, date of birth, ID band Patient awake    Reviewed: Allergy & Precautions, H&P , Patient's Chart, lab work & pertinent test results  Airway Mallampati: II TM Distance: >3 FB Neck ROM: full    Dental No notable dental hx.    Pulmonary neg pulmonary ROS,  breath sounds clear to auscultation  Pulmonary exam normal       Cardiovascular negative cardio ROS  Rhythm:regular Rate:Normal     Neuro/Psych negative neurological ROS  negative psych ROS   GI/Hepatic negative GI ROS, Neg liver ROS,   Endo/Other  negative endocrine ROS  Renal/GU negative Renal ROS     Musculoskeletal   Abdominal   Peds  Hematology negative hematology ROS (+)   Anesthesia Other Findings Anxiety     Depression        Irritable bowel syndrome (IBS)     UTI (urinary tract infection) during pregnancy        Arthritis     OCD (obsessive compulsive disorder)    Reproductive/Obstetrics (+) Pregnancy                           Anesthesia Physical Anesthesia Plan  ASA: II  Anesthesia Plan: Epidural   Post-op Pain Management:    Induction:   Airway Management Planned:   Additional Equipment:   Intra-op Plan:   Post-operative Plan:   Informed Consent: I have reviewed the patients History and Physical, chart, labs and discussed the procedure including the risks, benefits and alternatives for the proposed anesthesia with the patient or authorized representative who has indicated his/her understanding and acceptance.     Plan Discussed with:   Anesthesia Plan Comments:         Anesthesia Quick Evaluation

## 2012-03-11 NOTE — Progress Notes (Signed)
Pt comfortable.  FHTs 120s gstv, NST R, class 1 Toco q5-10  SVE 4/80/-2, AROM clear.  Continue induction for cholestasis.

## 2012-03-11 NOTE — Anesthesia Procedure Notes (Signed)
Epidural Patient location during procedure: OB Start time: 03/11/2012 9:57 AM  Staffing Anesthesiologist: Royce Macadamia., Mickle Asper. Performed by: anesthesiologist   Preanesthetic Checklist Completed: patient identified, site marked, surgical consent, pre-op evaluation, timeout performed, IV checked, risks and benefits discussed and monitors and equipment checked  Epidural Patient position: sitting Prep: site prepped and draped and DuraPrep Patient monitoring: continuous pulse ox and blood pressure Approach: midline Injection technique: LOR air and LOR saline  Needle:  Needle type: Tuohy  Needle gauge: 17 G Needle length: 9 cm and 9 Needle insertion depth: 5 cm cm Catheter type: closed end flexible Catheter size: 19 Gauge Catheter at skin depth: 10 cm Test dose: negative  Assessment Events: blood not aspirated, injection not painful, no injection resistance, negative IV test and no paresthesia  Additional Notes Patient identified.  Risk benefits discussed including failed block, incomplete pain control, headache, nerve damage, paralysis, blood pressure changes, nausea, vomiting, reactions to medication both toxic or allergic, and postpartum back pain.  Patient expressed understanding and wished to proceed.  All questions were answered.  Sterile technique used throughout procedure and epidural site dressed with sterile barrier dressing. No paresthesia or other complications noted.The patient did not experience any signs of intravascular injection such as tinnitus or metallic taste in mouth nor signs of intrathecal spread such as rapid motor block. Please see nursing notes for vital signs.

## 2012-03-12 LAB — CBC
HCT: 31.9 % — ABNORMAL LOW (ref 36.0–46.0)
MCHC: 32.9 g/dL (ref 30.0–36.0)
Platelets: 175 10*3/uL (ref 150–400)
RDW: 13.3 % (ref 11.5–15.5)
WBC: 12.6 10*3/uL — ABNORMAL HIGH (ref 4.0–10.5)

## 2012-03-12 NOTE — Anesthesia Postprocedure Evaluation (Signed)
  Anesthesia Post-op Note  Patient: Gabriela Moses  Procedure(s) Performed: * No procedures listed *  Patient Location: Mother/Baby  Anesthesia Type:Epidural  Level of Consciousness: awake  Airway and Oxygen Therapy: Patient Spontanous Breathing  Post-op Pain: none  Post-op Assessment: Patient's Cardiovascular Status Stable, Respiratory Function Stable, Patent Airway, No signs of Nausea or vomiting, Adequate PO intake, Pain level controlled, No headache, No backache, No residual numbness and No residual motor weakness  Post-op Vital Signs: Reviewed and stable  Complications: No apparent anesthesia complications

## 2012-03-12 NOTE — Discharge Summary (Signed)
Obstetric Discharge Summary Reason for Admission: induction of labor Prenatal Procedures: NST and ultrasound Intrapartum Procedures: spontaneous vaginal delivery and episiotomy midline Postpartum Procedures: none Complications-Operative and Postpartum: none Hemoglobin  Date Value Range Status  03/12/2012 10.5* 12.0 - 15.0 g/dL Final  07/23/2011 13.8   Final     HCT  Date Value Range Status  03/12/2012 31.9* 36.0 - 46.0 % Final    Physical Exam:  General: alert Lochia: appropriate Uterine Fundus: firm  Discharge Diagnoses: Early term delivery, Cholestasis of Pregnancy  Discharge Information: Date: 03/12/2012 Activity: pelvic rest Diet: routine Medications: PNV and Ibuprofen Condition: stable Instructions: refer to practice specific booklet Discharge to: home Follow-up Information    Follow up with Bobbye Charleston MD. Schedule an appointment as soon as possible for a visit in 4 weeks.   Contact information:   [place] [id number] [phone]          Newborn Data: Live born female  Birth Weight: 7 lb 5.6 oz (3334 g) APGAR: 8, 9  Home with mother.  Arsenia Goracke D 03/12/2012, 9:10 PM

## 2012-03-12 NOTE — Progress Notes (Signed)
Post Partum Day 1 Subjective: no complaints  Objective: Blood pressure 119/71, pulse 71, temperature 97.8 F (36.6 C), temperature source Oral, resp. rate 16, height 5' 7"  (1.702 m), weight 155 lb (70.308 kg), SpO2 97.00%, breastfeeding.  Physical Exam:  General: alert Lochia: appropriate Uterine Fundus: firm   Basename 03/12/12 0510 03/10/12 2045  HGB 10.5* 12.3  HCT 31.9* 36.3    Assessment/Plan: Plan for discharge tomorrow   LOS: 2 days   Julieth Tugman D 03/12/2012, 10:56 AM

## 2012-03-13 NOTE — Progress Notes (Signed)
CSW referral received due to pt's diagnosis of OCD.  According to RN, pt is doing well and providing appropriate care.  CSW intervention was not provided.  Please re consult if necessary.

## 2012-03-14 ENCOUNTER — Inpatient Hospital Stay (HOSPITAL_COMMUNITY): Admission: RE | Admit: 2012-03-14 | Payer: 59 | Source: Ambulatory Visit

## 2012-03-15 ENCOUNTER — Encounter (HOSPITAL_COMMUNITY)
Admission: RE | Admit: 2012-03-15 | Discharge: 2012-03-15 | Disposition: A | Discharge status: Home / Self Care | Payer: 59 | Source: Ambulatory Visit | Attending: Obstetrics & Gynecology | Admitting: Obstetrics & Gynecology

## 2012-03-15 DIAGNOSIS — O923 Agalactia: Secondary | ICD-10-CM | POA: Insufficient documentation

## 2012-03-15 NOTE — Consult Note (Signed)
Lactation Outpatient Note    Mother: Gabriela Moses      Baby: Gabriela Moses                                                      DOB: [birth year]                                                       Birth Weight: 7-5.6                                                        Discharge Weight: 6-13.9                                                         Weight today: 6-7.8/6-9.4 after feeding  Mom here to purchase bra and asking where to purchase a baby scale. Baby is home on double phototherapy and parents need to bring baby back this PM for bili check.  Home care company unable to do visit today and pedi would also weight check today.  Offered patient an appointment to come in this PM when they bring baby for bili check.  Evaluation: Parents here with baby for weight check.  Baby over 10 % weight loss today but mom's milk came in this AM.  Breasts are very full and somewhat engorged.  Assisted mom with latching baby to both breasts.  Baby latched well with good breast compression and nursed well with good stimulation and breast massage> baby gained approximately 1 1/2 oz at feeding. Plan: 1) breastfeed with any feeding cue but at least every 3 hours. 2) After feedings pump both breasts x 15 minutes and give baby 30-60 mls of EBM per bottle.  Mom rented a DEBP today.  Encouraged mom to call for follow up appointment prn.

## 2012-03-17 ENCOUNTER — Inpatient Hospital Stay (HOSPITAL_COMMUNITY): Payer: 59

## 2012-03-17 ENCOUNTER — Encounter (HOSPITAL_COMMUNITY): Payer: Self-pay | Admitting: *Deleted

## 2012-03-17 ENCOUNTER — Inpatient Hospital Stay (HOSPITAL_COMMUNITY)
Admission: AD | Admit: 2012-03-17 | Discharge: 2012-03-20 | DRG: 776 | Disposition: A | Discharge status: Home / Self Care | Payer: 59 | Source: Ambulatory Visit | Attending: Obstetrics and Gynecology | Admitting: Obstetrics and Gynecology

## 2012-03-17 DIAGNOSIS — F411 Generalized anxiety disorder: Secondary | ICD-10-CM | POA: Diagnosis present

## 2012-03-17 DIAGNOSIS — F429 Obsessive-compulsive disorder, unspecified: Secondary | ICD-10-CM | POA: Diagnosis present

## 2012-03-17 DIAGNOSIS — O99345 Other mental disorders complicating the puerperium: Secondary | ICD-10-CM | POA: Diagnosis present

## 2012-03-17 DIAGNOSIS — R51 Headache: Secondary | ICD-10-CM | POA: Diagnosis present

## 2012-03-17 DIAGNOSIS — O8612 Endometritis following delivery: Principal | ICD-10-CM | POA: Diagnosis present

## 2012-03-17 DIAGNOSIS — O99893 Other specified diseases and conditions complicating puerperium: Secondary | ICD-10-CM | POA: Diagnosis present

## 2012-03-17 LAB — CBC WITH DIFFERENTIAL/PLATELET
Basophils Absolute: 0 10*3/uL (ref 0.0–0.1)
Eosinophils Absolute: 0.1 10*3/uL (ref 0.0–0.7)
Eosinophils Relative: 2 % (ref 0–5)
Lymphocytes Relative: 17 % (ref 12–46)
Lymphs Abs: 1.6 10*3/uL (ref 0.7–4.0)
MCH: 28.6 pg (ref 26.0–34.0)
MCV: 84.7 fL (ref 78.0–100.0)
Neutrophils Relative %: 67 % (ref 43–77)
Platelets: 316 10*3/uL (ref 150–400)
RBC: 4.05 MIL/uL (ref 3.87–5.11)
RDW: 13.3 % (ref 11.5–15.5)
WBC: 9.6 10*3/uL (ref 4.0–10.5)

## 2012-03-17 LAB — WET PREP, GENITAL: Yeast Wet Prep HPF POC: NONE SEEN

## 2012-03-17 LAB — URINALYSIS, ROUTINE W REFLEX MICROSCOPIC
Nitrite: NEGATIVE
Protein, ur: NEGATIVE mg/dL
Specific Gravity, Urine: 1.025 (ref 1.005–1.030)
Urobilinogen, UA: 0.2 mg/dL (ref 0.0–1.0)

## 2012-03-17 LAB — COMPREHENSIVE METABOLIC PANEL
ALT: 39 U/L — ABNORMAL HIGH (ref 0–35)
AST: 21 U/L (ref 0–37)
Alkaline Phosphatase: 111 U/L (ref 39–117)
Calcium: 8.7 mg/dL (ref 8.4–10.5)
Potassium: 3.7 mEq/L (ref 3.5–5.1)
Sodium: 137 mEq/L (ref 135–145)
Total Protein: 6.4 g/dL (ref 6.0–8.3)

## 2012-03-17 LAB — LACTATE DEHYDROGENASE: LDH: 179 U/L (ref 94–250)

## 2012-03-17 LAB — URINE MICROSCOPIC-ADD ON

## 2012-03-17 MED ORDER — ACETAMINOPHEN 500 MG PO TABS
1000.0000 mg | ORAL_TABLET | Freq: Once | ORAL | Status: AC
  Administered 2012-03-17: 1000 mg via ORAL
  Filled 2012-03-17: qty 2

## 2012-03-17 NOTE — MAU Provider Note (Signed)
History     CSN: [id number]  Arrival date and time: 03/17/12 2029   First Provider Initiated Contact with Patient 03/17/12 2122      Chief Complaint  Patient presents with  . Fever  . Vaginal Discharge   HPI Gabriela Moses is a 27 y.o. female who presents to MAU with fever. The fever started today. She has been taking motrin. Associated symptoms include lower abdominal pain, headache that radiates down neck, vaginal discharge with odor, feeling bad all over, nausea, vomited x 1 yesterday. Sore throat this morning, sinus drainage and cough. Also rash on buttocks that spread to back. SVD 03/11/12. The history was provided by the patient.   OB History    Grav Para Term Preterm Abortions TAB SAB Ect Mult Living   2 1 1  1  1   1       Past Medical History  Diagnosis Date  . Anxiety   . Depression   . Irritable bowel syndrome (IBS)   . UTI (urinary tract infection) during pregnancy   . Arthritis   . OCD (obsessive compulsive disorder)     Past Surgical History  Procedure Date  . Wisdom tooth extraction     Family History  Problem Relation Age of Onset  . Hypertension Mother   . Hyperlipidemia Mother   . Arthritis Maternal Grandmother   . Hyperlipidemia Maternal Grandmother   . Hypertension Maternal Grandmother     History  Substance Use Topics  . Smoking status: Never Smoker   . Smokeless tobacco: Never Used  . Alcohol Use: Yes     Comment: prior to pregnancy    Allergies:  Allergies  Allergen Reactions  . Benadryl (Diphenhydramine Hcl) Rash    Childhood allergy  . Sulfa Antibiotics Rash    Childhood allergy  . Terconazole Rash and Other (See Comments)    Severe burning sensation with rash    Prescriptions prior to admission  Medication Sig Dispense Refill  . calcium carbonate (TUMS - DOSED IN MG ELEMENTAL CALCIUM) 500 MG chewable tablet Chew 1 tablet by mouth daily.      . fish oil-omega-3 fatty acids 1000 MG capsule Take 1 g by mouth daily.      Marland Kitchen  ibuprofen (ADVIL,MOTRIN) 200 MG tablet Take 400 mg by mouth every 6 (six) hours as needed.      . Prenatal Vit-Fe Fumarate-FA (PRENATAL MULTIVITAMIN) TABS Take 1 tablet by mouth daily.      . sertraline (ZOLOFT) 25 MG tablet Take 25 mg by mouth daily.        Review of Systems  Constitutional: Positive for fever, chills and malaise/fatigue. Negative for weight loss.  HENT: Positive for congestion, sore throat and neck pain. Negative for ear pain and nosebleeds.   Eyes: Negative for blurred vision, double vision, photophobia and pain.       Dots behind eyes "like fire fly's"  Respiratory: Positive for cough. Negative for shortness of breath and wheezing.   Cardiovascular: Negative for chest pain, palpitations and leg swelling.  Gastrointestinal: Negative for heartburn, nausea, vomiting, abdominal pain, diarrhea and constipation.  Genitourinary: Negative for dysuria, urgency and frequency.  Musculoskeletal: Positive for back pain. Negative for myalgias.  Skin: Positive for itching and rash.  Neurological: Positive for dizziness and headaches. Negative for sensory change, speech change, seizures and weakness.  Endo/Heme/Allergies: Does not bruise/bleed easily.  Psychiatric/Behavioral: Positive for depression. The patient is nervous/anxious.    Blood pressure 143/91, pulse 91, temperature 101.1 F (38.4 C),  temperature source Oral, resp. rate 18, height 5' 7"  (1.702 m), last menstrual period 01/26/2012, SpO2 99.00%, currently breastfeeding.  Physical Exam  Nursing note and vitals reviewed. Constitutional: She is oriented to person, place, and time. She appears well-developed and well-nourished. No distress.       Patient appears uncomfortable  HENT:  Head: Normocephalic and atraumatic.  Eyes: EOM are normal.  Neck: Normal range of motion. Neck supple.  Cardiovascular:       tachycardia  Respiratory: Effort normal. She has no wheezes. She has no rales.       Occasional rhonchi   GI:  Soft. There is tenderness in the right lower quadrant, suprapubic area and left lower quadrant. There is no CVA tenderness.  Genitourinary:       External genitalia with swelling at introitus, sutures visible. Small blood and mucous discharge vaginal vault. Tender on exam. Uterus slightly enlarged.   Musculoskeletal: Normal range of motion. She exhibits no edema.  Neurological: She is alert and oriented to person, place, and time.  Skin: Rash noted.       Chest and back with raised, erythematous, maculopapular.    Psychiatric: She has a normal mood and affect. Her behavior is normal. Judgment and thought content normal.   Procedures  Results for orders placed during the hospital encounter of 03/17/12 (from the past 24 hour(s))  CBC WITH DIFFERENTIAL     Status: Abnormal   Collection Time   03/17/12  9:28 PM      Component Value Range   WBC 9.6  4.0 - 10.5 K/uL   RBC 4.05  3.87 - 5.11 MIL/uL   Hemoglobin 11.6 (*) 12.0 - 15.0 g/dL   HCT 34.3 (*) 36.0 - 46.0 %   MCV 84.7  78.0 - 100.0 fL   MCH 28.6  26.0 - 34.0 pg   MCHC 33.8  30.0 - 36.0 g/dL   RDW 13.3  11.5 - 15.5 %   Platelets 316  150 - 400 K/uL   Neutrophils Relative 67  43 - 77 %   Neutro Abs 6.5  1.7 - 7.7 K/uL   Lymphocytes Relative 17  12 - 46 %   Lymphs Abs 1.6  0.7 - 4.0 K/uL   Monocytes Relative 14 (*) 3 - 12 %   Monocytes Absolute 1.4 (*) 0.1 - 1.0 K/uL   Eosinophils Relative 2  0 - 5 %   Eosinophils Absolute 0.1  0.0 - 0.7 K/uL   Basophils Relative 0  0 - 1 %   Basophils Absolute 0.0  0.0 - 0.1 K/uL  COMPREHENSIVE METABOLIC PANEL     Status: Abnormal   Collection Time   03/17/12  9:28 PM      Component Value Range   Sodium 137  135 - 145 mEq/L   Potassium 3.7  3.5 - 5.1 mEq/L   Chloride 105  96 - 112 mEq/L   CO2 23  19 - 32 mEq/L   Glucose, Bld 85  70 - 99 mg/dL   BUN 15  6 - 23 mg/dL   Creatinine, Ser 0.53  0.50 - 1.10 mg/dL   Calcium 8.7  8.4 - 10.5 mg/dL   Total Protein 6.4  6.0 - 8.3 g/dL   Albumin 2.6  (*) 3.5 - 5.2 g/dL   AST 21  0 - 37 U/L   ALT 39 (*) 0 - 35 U/L   Alkaline Phosphatase 111  39 - 117 U/L   Total Bilirubin 0.2 (*) 0.3 -  1.2 mg/dL   GFR calc non Af Amer >90  >90 mL/min   GFR calc Af Amer >90  >90 mL/min  URIC ACID     Status: Normal   Collection Time   03/17/12  9:28 PM      Component Value Range   Uric Acid, Serum 4.8  2.4 - 7.0 mg/dL  LACTATE DEHYDROGENASE     Status: Normal   Collection Time   03/17/12  9:28 PM      Component Value Range   LDH 179  94 - 250 U/L  URINALYSIS, ROUTINE W REFLEX MICROSCOPIC     Status: Abnormal   Collection Time   03/17/12  9:45 PM      Component Value Range   Color, Urine YELLOW  YELLOW   APPearance CLOUDY (*) CLEAR   Specific Gravity, Urine 1.025  1.005 - 1.030   pH 5.5  5.0 - 8.0   Glucose, UA NEGATIVE  NEGATIVE mg/dL   Hgb urine dipstick LARGE (*) NEGATIVE   Bilirubin Urine NEGATIVE  NEGATIVE   Ketones, ur NEGATIVE  NEGATIVE mg/dL   Protein, ur NEGATIVE  NEGATIVE mg/dL   Urobilinogen, UA 0.2  0.0 - 1.0 mg/dL   Nitrite NEGATIVE  NEGATIVE   Leukocytes, UA SMALL (*) NEGATIVE  URINE MICROSCOPIC-ADD ON     Status: Normal   Collection Time   03/17/12  9:45 PM      Component Value Range   Squamous Epithelial / LPF RARE  RARE   WBC, UA 0-2  <3 WBC/hpf   RBC / HPF TOO NUMEROUS TO COUNT  <3 RBC/hpf   Bacteria, UA RARE  RARE   Urine-Other MUCOUS PRESENT    WET PREP, GENITAL     Status: Abnormal   Collection Time   03/17/12 10:05 PM      Component Value Range   Yeast Wet Prep HPF POC NONE SEEN  NONE SEEN   Trich, Wet Prep NONE SEEN  NONE SEEN   Clue Cells Wet Prep HPF POC NONE SEEN  NONE SEEN   WBC, Wet Prep HPF POC FEW (*) NONE SEEN    US Pelvis Complete  03/17/2012  *RADIOLOGY REPORT*  Clinical Data: Pelvic pain, status post vaginal delivery.  TRANSABDOMINAL ULTRASOUND OF PELVIS  Technique:  Transabdominal ultrasound examination of the pelvis was performed including evaluation of the uterus, ovaries, adnexal regions, and  pelvic cul-de-sac. The patient declined transvaginal examination.  Comparison:  None.  Findings:  Uterus:  Normal postpartum appearance at 12.4 x 8.7 x 10.0 cm.  Endometrium: Heterogeneous and irregular, with fluid in the endometrial cavity.  No appreciable hypervascularity.  Right ovary: Normal sonographic appearance, measuring 3.2 x 2.0 x 1.6 cm.  Left ovary: Normal sonographic appearance, measuring 3.7 x 1.6 x 1.2 cm.  Other Findings:  No free fluid  IMPRESSION: Thickened/heterogeneous endometrium with fluid/debris within the endometrial cavity. Favored to reflect intrauterine clot/debris in the postpartum state.  No hypervascularity to confirm retained products of conception.  Correlate with beta HCG. Correlate clinically if concerned for endometritis.   Original Report Authenticated By: Carlos Levering, M.D.      Temp rechecked and is 102.3 Tylenol 1 gram PO given Blood cultures ordered  Assessment: 27 y.o. female s/p vaginal delivery with fever   Endometritis    Rash   URI  Plan:  Admit to Dr. Ouida Sills   Admission orders written  I have discussed this case with Dr. Dereck Leep, RN, [place], [facility] 03/18/2012, 12:02 AM

## 2012-03-17 NOTE — MAU Note (Signed)
Pt delivered on 03-12-12 vaginally and states she had has an abnormal odor to her vaginal bleeding.  Pt states she is having a lot of lower abd pain.  Has been having chills and night sweats.  Itching on palms of hands, fingers, and bottoms of feet.

## 2012-03-18 LAB — INFLUENZA PANEL BY PCR (TYPE A & B): Influenza A By PCR: NEGATIVE

## 2012-03-18 LAB — GC/CHLAMYDIA PROBE AMP
CT Probe RNA: NEGATIVE
GC Probe RNA: NEGATIVE

## 2012-03-18 MED ORDER — PRENATAL MULTIVITAMIN CH
1.0000 | ORAL_TABLET | Freq: Every day | ORAL | Status: DC
  Administered 2012-03-18 – 2012-03-20 (×3): 1 via ORAL
  Filled 2012-03-18 (×2): qty 1

## 2012-03-18 MED ORDER — ACETAMINOPHEN 325 MG PO TABS
650.0000 mg | ORAL_TABLET | ORAL | Status: DC | PRN
  Administered 2012-03-18 – 2012-03-19 (×3): 650 mg via ORAL
  Filled 2012-03-18 (×3): qty 2

## 2012-03-18 MED ORDER — LACTATED RINGERS IV SOLN
INTRAVENOUS | Status: DC
  Administered 2012-03-18: 125 mL/h via INTRAVENOUS
  Administered 2012-03-18 – 2012-03-20 (×6): via INTRAVENOUS

## 2012-03-18 MED ORDER — GENTAMICIN SULFATE 40 MG/ML IJ SOLN
Freq: Three times a day (TID) | INTRAVENOUS | Status: DC
  Administered 2012-03-18 – 2012-03-20 (×7): via INTRAVENOUS
  Filled 2012-03-18 (×7): qty 3.5

## 2012-03-18 MED ORDER — GENTAMICIN SULFATE 40 MG/ML IJ SOLN
Freq: Once | INTRAVENOUS | Status: AC
  Administered 2012-03-18: 03:00:00 via INTRAVENOUS
  Filled 2012-03-18: qty 3.75

## 2012-03-18 MED ORDER — CLINDAMYCIN PHOSPHATE 900 MG/50ML IV SOLN: 900.0000 mg | Freq: Three times a day (TID) | INTRAVENOUS | Status: DC

## 2012-03-18 MED ORDER — ALUM & MAG HYDROXIDE-SIMETH 200-200-20 MG/5ML PO SUSP
30.0000 mL | ORAL | Status: DC | PRN
  Administered 2012-03-18 – 2012-03-19 (×2): 30 mL via ORAL
  Filled 2012-03-18 (×2): qty 30

## 2012-03-18 MED ORDER — ONDANSETRON HCL 4 MG PO TABS: 4.0000 mg | ORAL_TABLET | Freq: Four times a day (QID) | ORAL | Status: DC | PRN

## 2012-03-18 MED ORDER — ONDANSETRON HCL 4 MG/2ML IJ SOLN
4.0000 mg | Freq: Four times a day (QID) | INTRAMUSCULAR | Status: DC | PRN
  Administered 2012-03-18: 4 mg via INTRAVENOUS
  Filled 2012-03-18: qty 2

## 2012-03-18 MED ORDER — DOCUSATE SODIUM 100 MG PO CAPS
100.0000 mg | ORAL_CAPSULE | Freq: Two times a day (BID) | ORAL | Status: DC
  Administered 2012-03-18 – 2012-03-19 (×3): 100 mg via ORAL
  Filled 2012-03-18 (×4): qty 1

## 2012-03-18 NOTE — Progress Notes (Signed)
03/18/12 1600  Clinical Encounter Type  Visited With Patient and family together (Husband Gabriela Moses)  Visit Type Follow-up;Spiritual support;Social support  Referral From Chaplain;Nurse  Spiritual Encounters  Spiritual Needs Emotional  Stress Factors  Patient Stress Factors (Per pt/fam, truggling with OCD, anxiety, postpartum hormones)  Family Stress Factors (Per pt/fam, truggling with OCD, anxiety, postpartum hormones)    Made lengthy, detailed visit (ca 90 min) with Gabriela Moses and husband Gabriela Moses, who shared very frankly about their experiences with [name] OCD, anxiety, worries, and need for reassurance, and the relationship dynamics/cycle/feedback loop that these needs create.  They welcome support and input from medical support team, including ob/gyn and psych/therapy.  Provided pastoral presence, listening, opportunity for both to share and reflect on feelings, affirmation of strengths and examples of successful moments.  I share the hope that a psych consult might help them identify needs, appropriate means of support, and a plan for follow-up care.  Please page [phone] if further chaplain support is desired.  Thank you!  [place], [place]

## 2012-03-18 NOTE — H&P (Signed)
Pt is a 27 year old white female, s/p uncomplicated SVD, who presents c/o a 12 hour h.o. Increasing LAP, a foul odor per vagina, shaking chills, a possible fever, and a rash on her back.  While in the ER she had a temp of 102. She denied any UTI symptoms, breast tenderness, back pain. Her CBC was normal, UA was normal.  Her ultrasound was normal.  On exam the only finding was tenderness in her lower abdomen with guarding. It appeared that the uterus was the source of tenderness.  PE: Tmax-102         HEENT- wnl         Breasts- full non tender, no nodes.         Back- non descript rash on lower back, no CVAT         Abd- NABS, non distended, tender - lower abd with guarding, no masses         Exts- wnl        Pelvic- per nurse practioner IMP/ Endometritis PLAN/ Check blood cultures            Clinda/Gent

## 2012-03-18 NOTE — Consult Note (Signed)
ANTIBIOTIC CONSULT NOTE - INITIAL  Pharmacy Consult for Gentamicin Indication: Endometritis   Allergies  Allergen Reactions  . Benadryl (Diphenhydramine Hcl) Rash    Childhood allergy  . Sulfa Antibiotics Rash    Childhood allergy  . Terconazole Rash and Other (See Comments)    Severe burning sensation with rash    Patient Measurements: Height: 5' 7"  (170.2 cm) Weight: 142 lb (64.411 kg) IBW/kg (Calculated) : 61.6  Adjusted Body Weight: 64.4  Vital Signs: Temp: 98.5 F (36.9 C) (01/30 0145) Temp src: Oral (01/30 0145) BP: 125/75 mmHg (01/30 0145) Pulse Rate: 75  (01/30 0145) Intake/Output from previous day:   Intake/Output from this shift:    Labs:  [facility] 03/17/12 2128  WBC 9.6  HGB 11.6*  PLT 316  LABCREA --  CREATININE 0.53   Estimated Creatinine Clearance: 103.6 ml/min (by C-G formula based on Cr of 0.53). No results found for this basename: VANCOTROUGH:2,VANCOPEAK:2,VANCORANDOM:2,GENTTROUGH:2,GENTPEAK:2,GENTRANDOM:2,TOBRATROUGH:2,TOBRAPEAK:2,TOBRARND:2,AMIKACINPEAK:2,AMIKACINTROU:2,AMIKACIN:2, in the last 72 hours   Microbiology: Recent Results (from the past 720 hour(s))  WET PREP, GENITAL     Status: Abnormal   Collection Time   02/21/12  8:10 PM      Component Value Range Status Comment   Yeast Wet Prep HPF POC NONE SEEN  NONE SEEN Final    Trich, Wet Prep NONE SEEN  NONE SEEN Final    Clue Cells Wet Prep HPF POC FEW (*) NONE SEEN Final    WBC, Wet Prep HPF POC MODERATE (*) NONE SEEN Final MANY BACTERIA SEEN  GC/CHLAMYDIA PROBE AMP     Status: Normal   Collection Time   02/21/12  8:10 PM      Component Value Range Status Comment   CT Probe RNA NEGATIVE  NEGATIVE Final    GC Probe RNA NEGATIVE  NEGATIVE Final   WET PREP, GENITAL     Status: Abnormal   Collection Time   03/17/12 10:05 PM      Component Value Range Status Comment   Yeast Wet Prep HPF POC NONE SEEN  NONE SEEN Final    Trich, Wet Prep NONE SEEN  NONE SEEN Final    Clue Cells Wet Prep  HPF POC NONE SEEN  NONE SEEN Final    WBC, Wet Prep HPF POC FEW (*) NONE SEEN Final MODERATE BACTERIA SEEN    Medical History: Past Medical History  Diagnosis Date  . Anxiety   . Depression   . Irritable bowel syndrome (IBS)   . UTI (urinary tract infection) during pregnancy   . Arthritis   . OCD (obsessive compulsive disorder)     Medications:  Clindamycin 900 mg IV every 8 hours  Assessment: Pt is a 27 yo G2P1 s/p NSVD 03/12/12. Pregnancy complicated by cholestasis with induced early term delivery at 37 weeks. Pt presents with elevated temperature (TMax 102.3), chills and pelvic pain.    Goal of Therapy:  Gentamicin peaks 6-8 mcg/ml; troughs < 1 mcgml  Plan:  Gentamicin 150 mg IV loading dose; maintenance dose 140 mg IV every 8 hours (mixed with clindamycin) We will monitor serum creatinine per protocol and gentamicin serum levels as indicated.   Norberto Sorenson 03/18/2012,2:12 AM

## 2012-03-18 NOTE — Progress Notes (Signed)
I received a referral from pt's nurse because Gabriela Moses seemed particularly anxious.  Gabriela Moses is very concerned about her health and has some anxiety about her new responsibilities as a mother.   She has been seeing a therapist for the last several months, particularly since she went off of her medication that helps her cope with anxiety and depression while she was pregnant.  I encouraged her to let her therapist know about her current situation.  She reports that she has been diagnosed with depression, anxiety and OCD and has been on medication since high school.  She reported that she is now back on the lowest dose of her medication since she is breastfeeding.  Because she is overwhelmed by her current state of health, she would benefit from speaking with a psychiatrist.  She said that she would be willing to speak with someone here in the hospital.    I offered emotional and spiritual support and gave her an opportunity to name her concerns and fears aloud.    Please page as needs arise, [phone].  Chaplain Janne Napoleon 10:57 AM   03/18/12 1000  Clinical Encounter Type  Visited With Patient  Visit Type Initial;Spiritual support;Psychological support  Referral From Nurse  Spiritual Encounters  Spiritual Needs Emotional  Stress Factors  Patient Stress Factors Health changes;Major life changes

## 2012-03-18 NOTE — Progress Notes (Signed)
Nursing:  [id number] Pt extremely anxious and tearful, she reports " I'm so scared, there's something wrong with me."  I informed her her flu swab came back Neg,  And that made her cry harder.  I sat with her for awhile and told her I was concerned about postpartum depression.  She said she understood and requested that I call a therapist to come and talk with her,  I called Dr. Mignon Pine and reviewed case with her, I went to the bedside while on the phone and verified c pt. And husband, the name of her therapist, and the medication (Zoloft) she was on.  Dr. Harrington Challenger  stated she would do the [facility] referral. . The husband  gave a little more hx, and stated that , " she gets like this sometimes, but that things have been worse lately".  I told him my concerns, and he was supportive and informitive, the pt just sat staring while we were talking and seemed disengaged, and when I included her in the conversation, she started crying again.   I left them and called the chaplain,(Lisa)   She came right up and offered support to the family.  Edmonia Caprio RN

## 2012-03-18 NOTE — Progress Notes (Signed)
  S: Feeling anxious, very concerned about meningitis due to headache and rash.   O:  Filed Vitals:   03/18/12 0139 03/18/12 0145 03/18/12 0200 03/18/12 0548  BP: 127/84 125/75  132/80  Pulse: 77 75  77  Temp: 97.7 F (36.5 C) 98.5 F (36.9 C)  98.8 F (37.1 C)  TempSrc: Oral Oral  Oral  Resp: 18 18  18   Height:   5' 7"  (1.702 m)   Weight:   64.411 kg (142 lb)   SpO2: 98% 96%  98%   AOX3, anxious appearing Neck supple with full ROM, able to touch chin to chest abd soft, NT, ND Skin no significant rash noted on back or buttocks  CBC    Component Value Date/Time   WBC 9.6 03/17/2012 2128   RBC 4.05 03/17/2012 2128   HGB 11.6* 03/17/2012 2128   HGB 13.8 07/23/2011   HCT 34.3* 03/17/2012 2128   PLT 316 03/17/2012 2128   PLT 269 07/23/2011   MCV 84.7 03/17/2012 2128   MCH 28.6 03/17/2012 2128   MCHC 33.8 03/17/2012 2128   RDW 13.3 03/17/2012 2128   LYMPHSABS 1.6 03/17/2012 2128   MONOABS 1.4* 03/17/2012 2128   EOSABS 0.1 03/17/2012 2128   BASOSABS 0.0 03/17/2012 2128    CMP     Component Value Date/Time   NA 137 03/17/2012 2128   K 3.7 03/17/2012 2128   CL 105 03/17/2012 2128   CO2 23 03/17/2012 2128   GLUCOSE 85 03/17/2012 2128   BUN 15 03/17/2012 2128   CREATININE 0.53 03/17/2012 2128   CREATININE 0.54 02/10/2012 1250   CALCIUM 8.7 03/17/2012 2128   PROT 6.4 03/17/2012 2128   ALBUMIN 2.6* 03/17/2012 2128   AST 21 03/17/2012 2128   ALT 39* 03/17/2012 2128   ALKPHOS 111 03/17/2012 2128   BILITOT 0.2* 03/17/2012 2128   GFRNONAA >90 03/17/2012 2128   GFRAA >90 03/17/2012 2128    A/P: HD #1 for readmit for PP endometritis 1) ID: Pts current exam benign.  Pt very anxious, perseverating on the diagnosis of meningitis.  Reassured that headache and mild neck discomfort are not diagnostic of meningitis.  If patients headache worsens or nuchal rigidity worsen will consider ID or neuro consult. Currently on clinda and gent for endometritis. Blood cultures pending. Will also obtain an influenza  PCR.

## 2012-03-19 MED ORDER — IBUPROFEN 600 MG PO TABS
600.0000 mg | ORAL_TABLET | Freq: Four times a day (QID) | ORAL | Status: DC | PRN
  Administered 2012-03-19 – 2012-03-20 (×2): 600 mg via ORAL
  Filled 2012-03-19 (×2): qty 1

## 2012-03-19 MED ORDER — SERTRALINE HCL 25 MG PO TABS
25.0000 mg | ORAL_TABLET | Freq: Every day | ORAL | Status: DC
  Administered 2012-03-19 – 2012-03-20 (×2): 25 mg via ORAL
  Filled 2012-03-19 (×3): qty 1

## 2012-03-19 NOTE — Progress Notes (Signed)
This was a follow-up with Gabriela Moses and her family.   She was having difficulty connecting in conversation and is very overwhelmed.  I gave her the space to name some of her fears and her emotions and helped her to think through what strategies have helped her in the past.  Her husband is very supportive and he seems to be good at taking care of himself in the process.  They will have help from family in their home for the through next week.  After that, they don't have a plan for help while her husband is at work.  They may find that they need additional support.  Please page as needs arise, [phone].    [name] 12:24 PM   03/19/12 1200  Clinical Encounter Type  Visited With Patient and family together  Visit Type Follow-up;Spiritual support;Psychological support

## 2012-03-19 NOTE — Progress Notes (Signed)
Pt reports feeling sweaty overnight, but decreased tenderness of the abdomen.  She reports intermittent HA, no neck pain.  Minimal bleeding, occasional gushes.  She reports still feeling emotional unwell, but states consultation with chaplain was helpful.  She and husband are requesting consultation with psychiatry as well and would like pt to restart 25 mg Zoloft which she has been taking at home since delivery.  Consult placed with Dr. Maryruth Eve, spoken with directly.  She agrees with restart of Zoloft at 53m for 4 days then increase to 58mqd.  Will continue abx for endometritis until minimum of 24 hrs afebrile.  Reassess in am.

## 2012-03-19 NOTE — Progress Notes (Signed)
Patient Identification:  Gabriela Moses Date of Evaluation:  03/19/2012  History of Present Illness:Anxiety, OCD 1 week postpartum   Past Psychiatric History:Discussed with Dr. Rogue Bussing:  Pt had taken Zoloft until pregnant.  She stopped taking Zoloft during pregnancy.  Dr. Now wants to restart Zoloft and drug of choice of OCD.   Past Medical History:     Past Medical History  Diagnosis Date  . Anxiety   . Depression   . Irritable bowel syndrome (IBS)   . UTI (urinary tract infection) during pregnancy   . Arthritis   . OCD (obsessive compulsive disorder)        Past Surgical History  Procedure Date  . Wisdom tooth extraction     Allergies:  Allergies  Allergen Reactions  . Benadryl (Diphenhydramine Hcl) Rash    Childhood allergy  . Sulfa Antibiotics Rash    Childhood allergy  . Terconazole Rash and Other (See Comments)    Severe burning sensation with rash    Current Medications:  Prior to Admission medications   Medication Sig Start Date End Date Taking? Authorizing Provider  calcium carbonate (TUMS - DOSED IN MG ELEMENTAL CALCIUM) 500 MG chewable tablet Chew 1 tablet by mouth daily.   Yes Historical Provider, MD  fish oil-omega-3 fatty acids 1000 MG capsule Take 1 g by mouth daily.   Yes Historical Provider, MD  ibuprofen (ADVIL,MOTRIN) 200 MG tablet Take 400 mg by mouth every 6 (six) hours as needed.   Yes Historical Provider, MD  Prenatal Vit-Fe Fumarate-FA (PRENATAL MULTIVITAMIN) TABS Take 1 tablet by mouth daily.   Yes Historical Provider, MD  sertraline (ZOLOFT) 25 MG tablet Take 25 mg by mouth daily.   Yes Historical Provider, MD    Social History:    reports that she has never smoked. She has never used smokeless tobacco. She reports that she drinks alcohol. She reports that she does not use illicit drugs.   Family History:    Family History  Problem Relation Age of Onset  . Hypertension Mother   . Hyperlipidemia Mother   . Arthritis Maternal  Grandmother   . Hyperlipidemia Maternal Grandmother   . Hypertension Maternal Grandmother     Mental Status Examination/Evaluation: Pending  DIAGNOSIS:   AXIS I   Hx of OCD, now anxious  AXIS II  Deffered  AXIS III See medical notes.  AXIS IV pending  AXIS V 41-50 serious symptoms    Assessment/Plan: Discussed with Dr. Rogue Bussing, [place] 03/20/12  Sat. A.m. Dr. Roney Jaffe she will start Zoloft sertraline 25 mg for several days, then increase to 50 mg.  Pt may require higher dose to quiet OCD thoughts.  Dr. Is also offered option of using Luvox, which is also approved for OCD sx. RECOMMENDATION:  1.  DESK at [facility]  [phone] to inform consultation will continue Saturday am.  03/19/2012 7:04 PM Charnele Semple J. Evern Bio, MD Psychiatrist

## 2012-03-19 NOTE — Progress Notes (Signed)
Called by RN for concern for patients level of anxiety.  Pt has a h/o anxiety and OCD.  She has been off medication during her pregnancy.  Previously she was on zoloft that had been initially recommended by psychiatry years ago.  She hasn't seen a psychiatrist in years and her prior OBGYN was refilling her medication.  She has been continuing to see a therapist throughout the pregnancy.  She is now postpartum and has expressed acute worsening of her anxiety. She is receptive to consultation with a psychiatrist.  I spoke with [facility] and requested a psychiatric consultation.

## 2012-03-20 NOTE — Progress Notes (Signed)
Sat with patient and patient's mom to discuss patient's status and follow up plans.  Pt states currently the pelvic pain has resolved, denies fever or other complaints.  She only feels intermittent pain that are likely cramps.  She is also feeling emotionally better and is smiling and interacting.  She has good family support and will either be staying with her mom or her mother-in-law will be staying with her for the next week to provide support, so she will not be alone.  Pt discussed with her mom and husband the consult to psychiatry as no one has come to see her yet.  They have decided to follow up with her therapist out pt and asked that the consult be cancelled so that she can go home.  She has been afebrile >24hrs.  Will discontinue abx and discharge patient with instructions to increase Zoloft to 50 mg qd in 2 days.  She will also follow up with [place] in the office next week.

## 2012-03-20 NOTE — Consult Note (Signed)
Reason for Consult: med adjustment Referring Physician: unknown  Gabriela Moses is an 27 y.o. female.  HPI:   Pt's chart and Dr. Evern Bio note was reviewed today. Attempt was made to see pt today around 2 PM but pt was discharged before that and per primary team she was feeling better.  Past Medical History  Diagnosis Date  . Anxiety   . Depression   . Irritable bowel syndrome (IBS)   . UTI (urinary tract infection) during pregnancy   . Arthritis   . OCD (obsessive compulsive disorder)     Past Surgical History  Procedure Date  . Wisdom tooth extraction     Family History  Problem Relation Age of Onset  . Hypertension Mother   . Hyperlipidemia Mother   . Arthritis Maternal Grandmother   . Hyperlipidemia Maternal Grandmother   . Hypertension Maternal Grandmother     Social History:  reports that she has never smoked. She has never used smokeless tobacco. She reports that she drinks alcohol. She reports that she does not use illicit drugs.  Allergies:  Allergies  Allergen Reactions  . Benadryl (Diphenhydramine Hcl) Rash    Childhood allergy  . Sulfa Antibiotics Rash    Childhood allergy  . Terconazole Rash and Other (See Comments)    Severe burning sensation with rash    Medications: I have reviewed the patient's current medications.  No results found for this or any previous visit (from the past 48 hour(s)).  No results found.  ROS Blood pressure 119/75, pulse 88, temperature 98.5 F (36.9 C), temperature source Oral, resp. rate 18, height 5' 7"  (1.702 m), weight 64.411 kg (142 lb), last menstrual period 01/26/2012, SpO2 99.00%, currently breastfeeding. Physical Exam  Assessment/Plan: See above  Raiford Simmonds 03/20/2012, 5:35 PM

## 2012-03-20 NOTE — Discharge Summary (Signed)
  Pt admitted 1/30 with fever and pelvic pain, diagnosed with endometritis and treated with anb until afebrile 24hrs.  While inpatient pt exhibited worsening emotional condition and was visited by chaplain services multiple times.  Pt was restarted on Zoloft 58m qd and a psych consult was placed.  Before psychiatry visited, pt started feeling better and asked to cancel consult, she plans to f/u outpt with her therapist.  Pt instructed to increase Zoloft to 575min 2 days, and see me in the office next week.

## 2012-03-20 NOTE — Progress Notes (Signed)
Discharge instructions provided to patient and family at bedside.  Medications, activity instructions, follow up appointments, when to call the doctor and community resources discussed.  All questions answered to patients satisfaction.  No further questions  Patient left unit with all personal belongings in stable condition accompanied by staff member.  Leighton Roach, RN-------

## 2012-03-24 LAB — CULTURE, BLOOD (ROUTINE X 2)

## 2013-10-04 ENCOUNTER — Encounter: Payer: Self-pay | Admitting: Internal Medicine

## 2013-10-04 ENCOUNTER — Ambulatory Visit (INDEPENDENT_AMBULATORY_CARE_PROVIDER_SITE_OTHER): Payer: 59 | Admitting: Internal Medicine

## 2013-10-04 VITALS — BP 112/72 | HR 95 | Ht 67.0 in | Wt 165.8 lb

## 2013-10-04 DIAGNOSIS — R059 Cough, unspecified: Secondary | ICD-10-CM

## 2013-10-04 DIAGNOSIS — R05 Cough: Secondary | ICD-10-CM

## 2013-10-04 DIAGNOSIS — R058 Other specified cough: Secondary | ICD-10-CM

## 2013-10-04 MED ORDER — PREDNISONE 10 MG PO TABS: ORAL_TABLET | ORAL | Status: DC

## 2013-10-04 MED ORDER — FAMOTIDINE 20 MG PO TABS: ORAL_TABLET | ORAL | Status: DC

## 2013-10-04 MED ORDER — TRAMADOL HCL 50 MG PO TABS: ORAL_TABLET | ORAL | Status: DC

## 2013-10-04 NOTE — Patient Instructions (Signed)
The key to effective treatment for your cough is eliminating the non-stop cycle of cough you're stuck in long enough to let your airway heal completely and then see if there is anything still making you cough once you stop the cough suppression, but this should take no more than 5 days to figure out  First take delsym two tsp every 12 hours and supplement if needed with  tramadol 50 mg up to 2 every 4 hours to suppress the urge to cough at all or even clear your throat. Swallowing water or using ice chips/non mint and menthol containing candies (such as lifesavers or sugarless jolly ranchers) are also effective.  You should rest your voice and avoid activities that you know make you cough.  Once you have eliminated the cough for 3 straight days try reducing the tramadol first,  then the delsym as tolerated.    Prednisone 10 mg take  4 each am x 2 days,   2 each am x 2 days,  1 each am x 2 days and stop (this is to eliminate allergies and inflammation from coughing)  Protonix (pantoprazole) Take 30-60 min before first meal of the day and Pepcid 20 mg one bedtime plus chlorpheniramine 4 mg x 2 at bedtime (both available over the counter)  until cough is completely gone for at least a week without the need for cough suppression  For drainage take chlortrimeton (chlorpheniramine) 4 mg every 4 hours available over the counter (may cause drowsiness)   GERD (REFLUX)  is an extremely common cause of respiratory symptoms, many times with no significant heartburn at all.    It can be treated with medication, but also with lifestyle changes including avoidance of late meals, excessive alcohol, smoking cessation, and avoid fatty foods, chocolate, peppermint, colas, red wine, and acidic juices such as orange juice.  NO MINT OR MENTHOL PRODUCTS SO NO COUGH DROPS  USE HARD CANDY INSTEAD (jolley ranchers or Stover's or Lifesavers (all available in sugarless versions) NO OIL BASED VITAMINS - use powdered  substitutes.  Need to return in 2 weeks if not 100% better

## 2013-10-04 NOTE — Progress Notes (Signed)
Subjective:    Patient ID: Gabriela Moses, female    DOB: [birth year] MRN: [id number]  HPI  27 yowf never smoker so some problem with ear infection/ R TM rupture but outgrew by middle school with occasional cough bad to go to a doctor always rapidly responds with short course abx s inhalers or prednisone referred to pulmonary clinic for persistent cough since early July 2015  10/04/2013 1st [facility] office visit/ Hansford Hirt  Chief Complaint  Patient presents with  . PULMONARY CONSULT    Referred by Dr Belenda Cruise for cough. "Feels like itching in chest and throat." Recent cxr.   acute onset cough/sore throat > green/ brown mucus rx amox x 10d and prednisone/inhalers  Everything got better except for sense of itching chest/throat and dry cough and urge to clear the throat.  Cough not  better with albuterol - may help tightness   Tendency to noct cough in winter seems to correspond to whether the heat is on or how cold it is outside.  Snoring x 5 y getting worse, no daytime congestion      Kouffman Reflux v Neurogenic Cough Differentiator Reflux Comments  Do you awaken from a sound sleep coughing violently?                            With trouble breathing? Not lately   Do you have choking episodes when you cannot  Get enough air, gasping for air ?              yes   Do you usually cough when you lie down into  The bed, or when you just lie down to rest ?                          Yes,  Wears off   Do you usually cough after meals or eating?         Yes   Do you cough when (or after) you bend over?    no   GERD SCORE     Kouffman Reflux v Neurogenic Cough Differentiator Neurogenic   Do you more-or-less cough all day long? yes   Does change of temperature make you cough? No - if gets hot with activity   Does laughing or chuckling cause you to cough? yes   Do fumes (perfume, automobile fumes, burned  Toast, etc.,) cause you to cough ?      no   Does speaking, singing, or talking on the  phone cause you to cough   ?               Yes    Neurogenic/Airway score       No obvious day to day or daytime variabilty or assoc chronic cp or chest tightness, subjective wheeze overt sinus or hb symptoms. No unusual exp hx or h/o childhood pna/ asthma or knowledge of premature birth.  Sleeping ok without nocturnal  or early am exacerbation  of respiratory  c/o's or need for noct saba. Also denies any obvious fluctuation of symptoms with weather or environmental changes or other aggravating or alleviating factors except as outlined above   Current Medications, Allergies, Complete Past Medical History, Past Surgical History, Family History, and Social History were reviewed in [employer] record.           Review of Systems  Constitutional: Negative for fever and unexpected weight  change.  HENT: Positive for congestion. Negative for dental problem, ear pain, nosebleeds, postnasal drip, rhinorrhea, sinus pressure, sneezing, sore throat and trouble swallowing.   Eyes: Negative for redness and itching.  Respiratory: Positive for cough and shortness of breath. Negative for chest tightness and wheezing.   Cardiovascular: Positive for chest pain. Negative for palpitations and leg swelling.  Gastrointestinal: Negative for nausea and vomiting.       Acid heartburn  Genitourinary: Negative for dysuria.  Musculoskeletal: Negative for joint swelling.  Skin: Negative for rash ( itching).  Neurological: Positive for headaches.  Hematological: Does not bruise/bleed easily.  Psychiatric/Behavioral: Positive for dysphoric mood. The patient is nervous/anxious.        Objective:   Physical Exam   Wt Readings from Last 3 Encounters:  10/04/13 165 lb 12.8 oz (75.206 kg)  03/18/12 142 lb (64.411 kg)  03/10/12 155 lb (70.308 kg)     amb wf nad   HEENT: nl dentition, turbinates, and orophanx. Nl external ear canals without cough reflex   NECK :  without JVD/Nodes/TM/  nl carotid upstrokes bilaterally   LUNGS: no acc muscle use, clear to A and P bilaterally with cough on insp    CV:  RRR  no s3 or murmur or increase in P2, no edema   ABD:  soft and nontender with nl excursion in the supine position. No bruits or organomegaly, bowel sounds nl  MS:  warm without deformities, calf tenderness, cyanosis or clubbing  SKIN: warm and dry without lesions    NEURO:  alert, approp, no deficits     cxr reported nl w/in last 2 weeks, not on file       Assessment & Plan:

## 2013-10-04 NOTE — Assessment & Plan Note (Signed)
The most common causes of chronic cough in immunocompetent adults include the following: upper airway cough syndrome (UACS), previously referred to as postnasal drip syndrome (PNDS), which is caused by variety of rhinosinus conditions; (2) asthma; (3) GERD; (4) chronic bronchitis from cigarette smoking or other inhaled environmental irritants; (5) nonasthmatic eosinophilic bronchitis; and (6) bronchiectasis.   These conditions, singly or in combination, have accounted for up to 94% of the causes of chronic cough in prospective studies.   Other conditions have constituted no >6% of the causes in prospective studies These have included bronchogenic carcinoma, chronic interstitial pneumonia, sarcoidosis, left ventricular failure, ACEI-induced cough, and aspiration from a condition associated with pharyngeal dysfunction.    Chronic cough is often simultaneously caused by more than one condition. A single cause has been found from 38 to 82% of the time, multiple causes from 18 to 62%. Multiply caused cough has been the result of three diseases up to 42% of the time.       Based on hx and exam, this is   likely:  Cough variant asthma or much more likely Classic Upper airway cough syndrome, so named because it's frequently impossible to sort out how much is  CR/sinusitis with freq throat clearing (which can be related to primary GERD)   vs  causing  secondary (" extra esophageal")  GERD from wide swings in gastric pressure that occur with throat clearing, often  promoting self use of mint and menthol lozenges that reduce the lower esophageal sphincter tone and exacerbate the problem further in a cyclical fashion.   These are the same pts (now being labeled as having "irritable larynx syndrome" by some cough centers) who not infrequently have a history of having failed to tolerate ace inhibitors,  dry powder inhalers or biphosphonates or report having atypical reflux symptoms that don't respond to standard doses  of PPI , and are easily confused as having aecopd or asthma flares by even experienced allergists/ pulmonologists.   The first step is to maximize acid suppression and eliminate cyclical coughing then regroup if the cough persists in 2 weeks   See instructions for specific recommendations which were reviewed directly with the patient who was given a copy with highlighter outlining the key components.    If cough recurs would put cough variant asthma at top of list

## 2013-10-06 ENCOUNTER — Telehealth: Payer: Self-pay | Admitting: Internal Medicine

## 2013-10-06 MED ORDER — PANTOPRAZOLE SODIUM 40 MG PO TBEC: DELAYED_RELEASE_TABLET | ORAL | Status: DC

## 2013-10-06 NOTE — Telephone Encounter (Signed)
Called and spoke to pt. Pt stated she did not have any of the prescriptions MW wanted her to take.The pt's tramdol, prednisone, and pepcid were printed and given to pt. Pt stated she did not know she needed to take those into the pharmacy. I informed pt to take them into the pharmacy and I will send in the protonix. Rx sent. Pt verbalized understanding and denied any further questions or concerns at this time.

## 2013-10-18 ENCOUNTER — Ambulatory Visit: Payer: 59 | Admitting: Internal Medicine

## 2013-12-19 ENCOUNTER — Encounter: Payer: Self-pay | Admitting: Internal Medicine

## 2014-04-27 IMAGING — US US OB COMP +14 WK
1 series · 12 of 28 positions shown · non-contrast
Comparison: none

[Series 1: us ob comp +14 wk · 45 acquisitions, 12 frames shown]
[im 2/45]
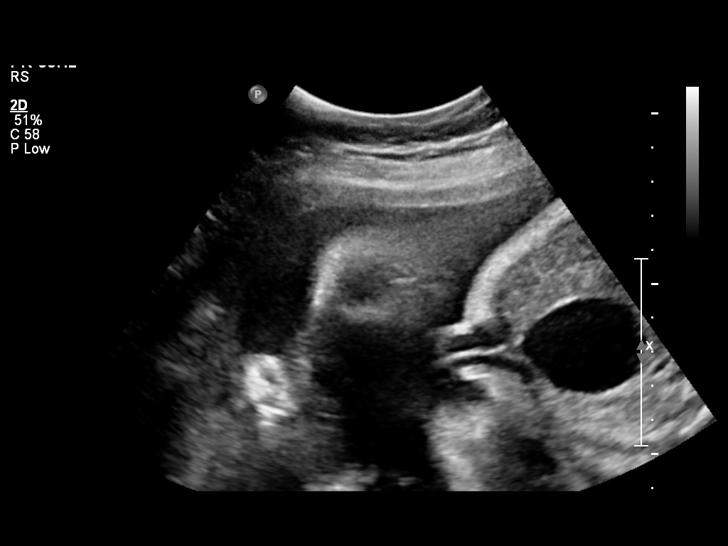
[im 5/45]
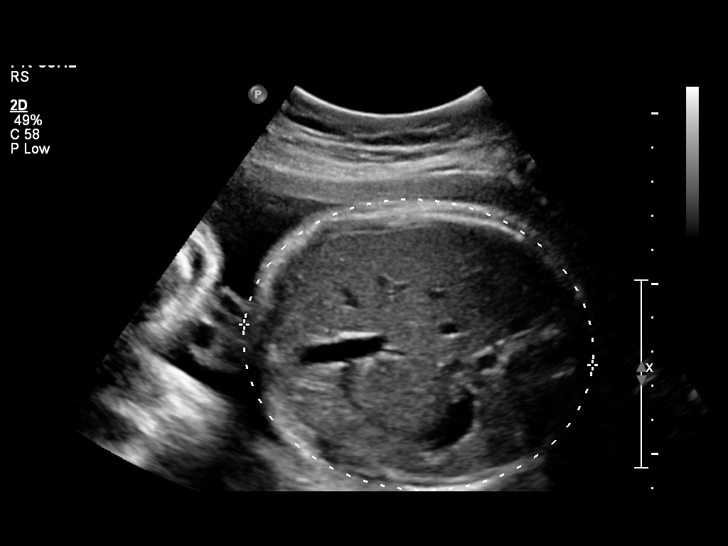
[im 9/45]
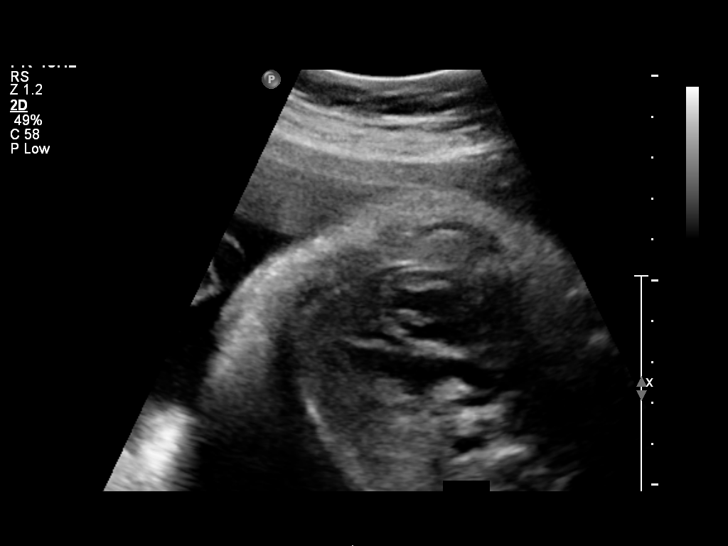
[im 14/45]
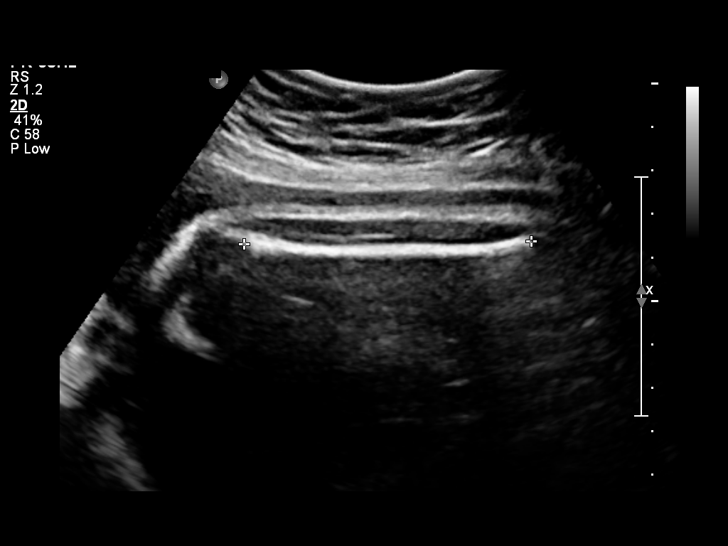
[im 17/45]
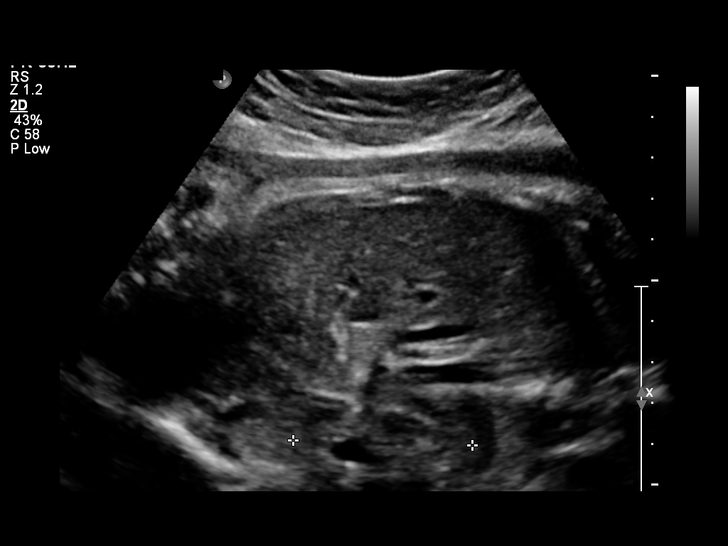
[im 20/45]
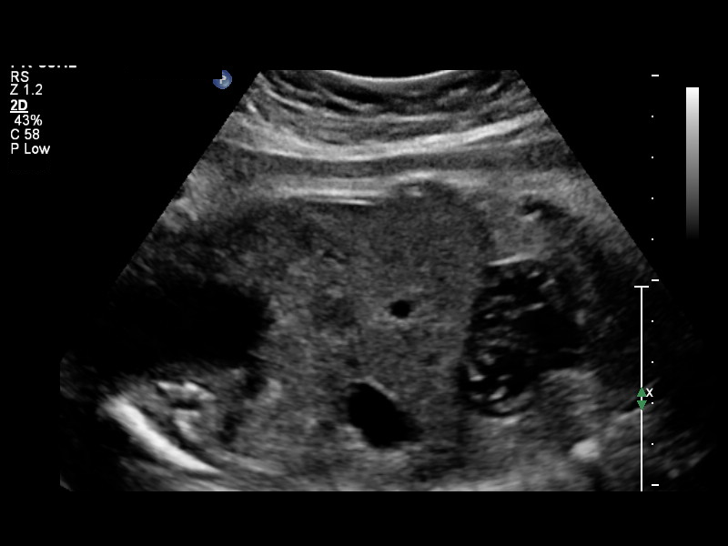
[im 25/45]
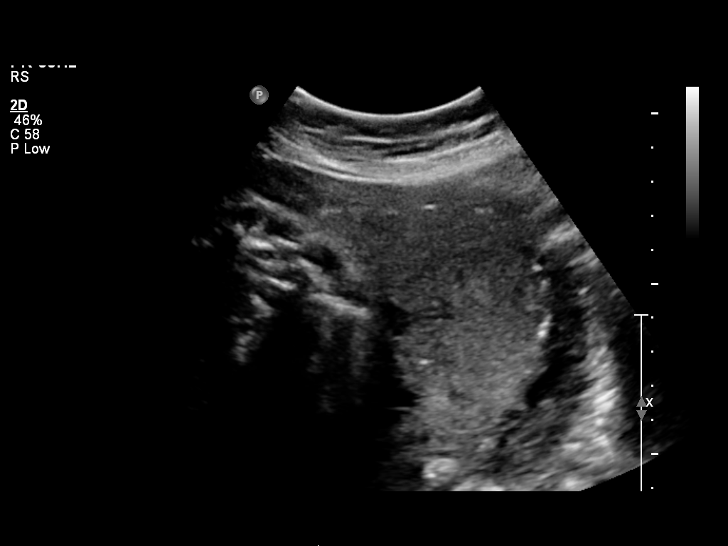
[im 28/45]
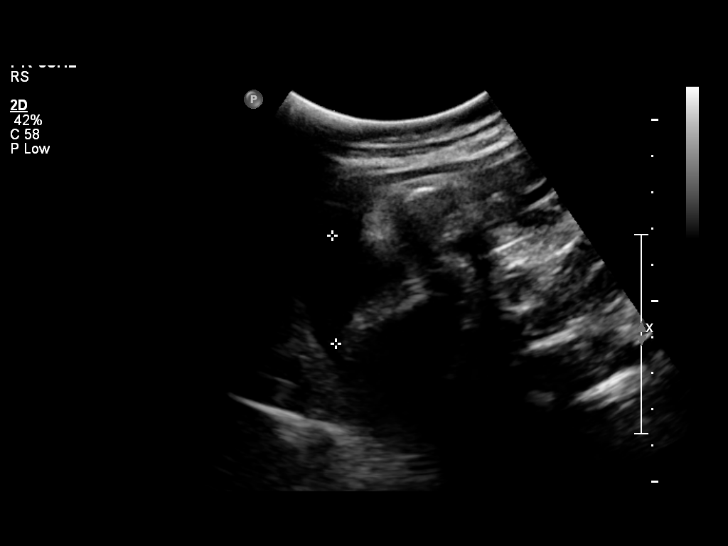
[im 31/45]
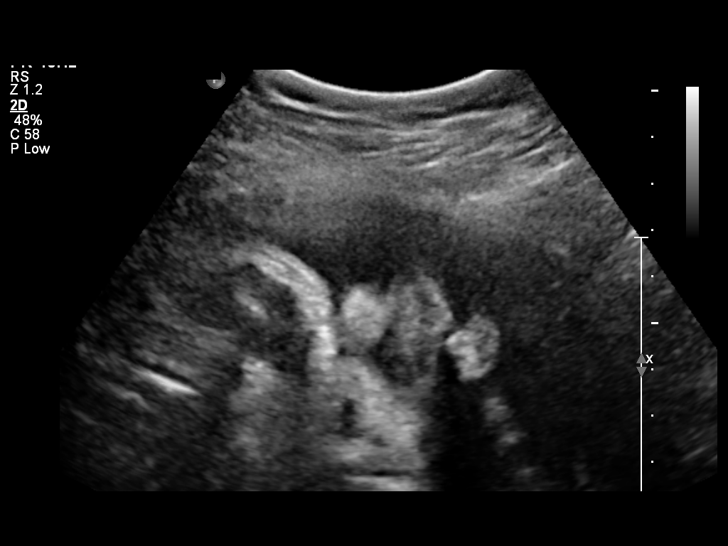
[im 36/45]
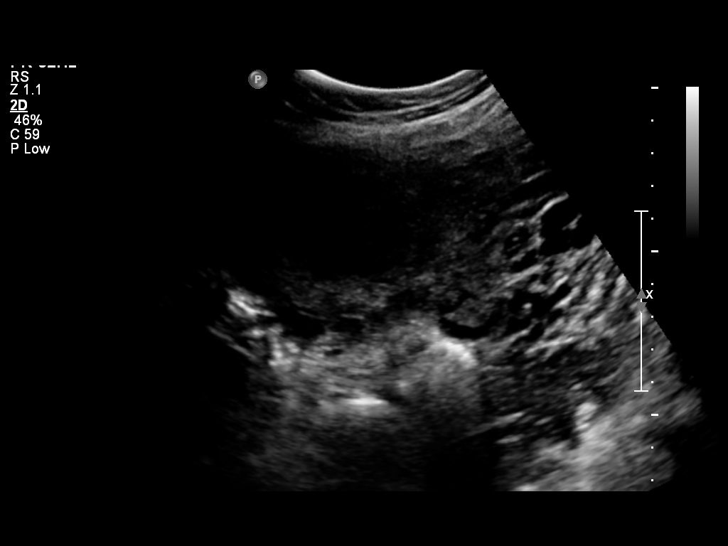
[im 40/45]
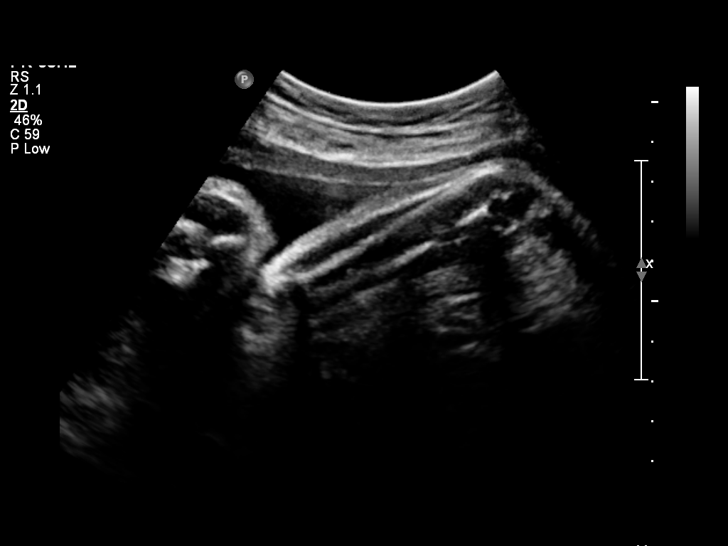
[im 43/45]
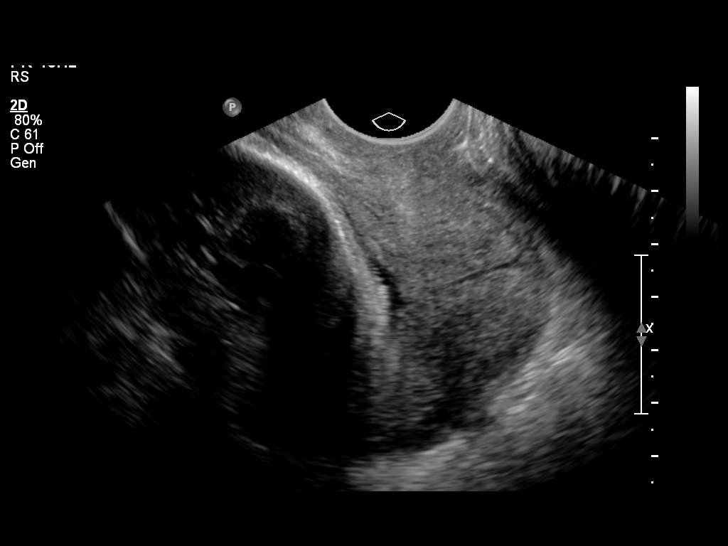

[12 of 28 positions shown; findings below may reference images not displayed]

OBSTETRICS REPORT
                      (Signed Final 02/12/2012 [DATE])

Service(s) Provided

 US OB COMP + 14 WK                                    76805.1
 US OB TRANSVAGINAL                                    76817.0
Indications

 Elevated LFT's
 Basic anatomic survey
Fetal Evaluation

 Num Of Fetuses:    1
 Fetal Heart Rate:  132                         bpm
 Cardiac Activity:  Observed
 Presentation:      Cephalic
 Placenta:          Left lateral, above cervical
                    os
 P. Cord            Visualized, central
 Insertion:

 Amniotic Fluid
 AFI FV:      Subjectively within normal limits
 AFI Sum:     10.74   cm      23   %Tile     Larg Pckt:   4.28   cm
 RUQ:   2.97   cm    RLQ:    4.28   cm    LLQ:   3.49    cm
Biometry

 BPD:       84  mm    G. Age:   33w 6d                CI:        72.81   70 - 86
                                                      FL/HC:      21.0   19.9 -

 HC:       313  mm    G. Age:   35w 1d       75  %    HC/AC:      1.06   0.96 -

 AC:     295.3  mm    G. Age:   33w 4d       73  %    FL/BPD:     78.2   71 - 87
 FL:      65.7  mm    G. Age:   33w 6d       69  %    FL/AC:      22.2   20 - 24
 HUM:     59.1  mm    G. Age:   34w 1d       88  %
 Est. FW:    8822  gm          5 lb      77  %
Gestational Age

 Clinical EDD:  32w 5d                                        EDD:   04/01/12
 U/S Today:     34w 1d                                        EDD:   03/22/12
 Best:          32w 5d    Det. By:   Clinical EDD             EDD:   04/01/12
Anatomy
 Cranium:          Appears normal         Aortic Arch:      Basic anatomy
                                                            exam per order
 Fetal Cavum:      Not well visualized    Ductal Arch:      Basic anatomy
                                                            exam per order
 Ventricles:       Not well visualized    Diaphragm:        Appears normal
 Choroid Plexus:   Not well visualized    Stomach:          Appears normal, left
                                                            sided
 Cerebellum:       Not well visualized    Abdomen:          Appears normal
 Posterior Fossa:  Not well visualized    Abdominal Wall:   Appears nml (cord
                                                            insert, abd wall)
 Nuchal Fold:      Not applicable (>20    Cord Vessels:     Appears normal (3
                   wks GA)                                  vessel cord)
 Face:             Profile appears        Kidneys:          Appear normal
                   normal
 Lips:             Appears normal         Bladder:          Appears normal
 Heart:            Appears normal         Spine:            Not well visualized
                   (4CH, axis, and
                   situs)
 RVOT:             Appears normal         Lower             Present
                                          Extremities:
 LVOT:             Appears normal         Upper             Present
                                          Extremities:

 Other:  Fetus appears to be a male. Technically difficult due to advanced
         gestational age.
Cervix Uterus Adnexa

 Cervical Length:   2.61      cm

 Cervix:       Normal appearance by transvaginal scan
 Uterus:       No abnormality visualized.

 Adnexa:     No abnormality visualized.
Impression

 Single living IUP with assigned GA of 32w 5d. EFW is at the
 77th percentile for assigned GA.
 Technically difficult anatomic evaluation due to advanced GA.
 No fetal anatomic abnormality is identified.
 Normal amniotic fluid volume and cervical length.

## 2014-07-06 ENCOUNTER — Encounter (HOSPITAL_COMMUNITY): Admission: RE | Payer: Self-pay | Source: Ambulatory Visit

## 2014-07-06 ENCOUNTER — Ambulatory Visit (HOSPITAL_COMMUNITY)
Admission: RE | Admit: 2014-07-06 | Payer: Managed Care, Other (non HMO) | Source: Ambulatory Visit | Admitting: Gastroenterology

## 2014-07-06 SURGERY — ESOPHAGOGASTRODUODENOSCOPY (EGD) WITH PROPOFOL
Anesthesia: Monitor Anesthesia Care

## 2014-07-27 ENCOUNTER — Other Ambulatory Visit: Payer: Self-pay | Admitting: Gastroenterology

## 2014-07-27 DIAGNOSIS — R1011 Right upper quadrant pain: Secondary | ICD-10-CM

## 2014-08-01 ENCOUNTER — Ambulatory Visit
Admission: RE | Admit: 2014-08-01 | Discharge: 2014-08-01 | Disposition: A | Discharge status: Home / Self Care | Payer: Managed Care, Other (non HMO) | Source: Ambulatory Visit | Attending: Gastroenterology | Admitting: Gastroenterology

## 2014-08-01 DIAGNOSIS — R1011 Right upper quadrant pain: Secondary | ICD-10-CM

## 2014-08-08 ENCOUNTER — Other Ambulatory Visit: Payer: Managed Care, Other (non HMO)

## 2014-08-29 ENCOUNTER — Other Ambulatory Visit: Payer: Self-pay | Admitting: Obstetrics and Gynecology

## 2014-08-29 DIAGNOSIS — N644 Mastodynia: Secondary | ICD-10-CM

## 2014-09-28 ENCOUNTER — Other Ambulatory Visit: Payer: Self-pay | Admitting: Obstetrics and Gynecology

## 2014-09-28 DIAGNOSIS — N644 Mastodynia: Secondary | ICD-10-CM

## 2014-10-10 ENCOUNTER — Ambulatory Visit
Admission: RE | Admit: 2014-10-10 | Discharge: 2014-10-10 | Disposition: A | Discharge status: Home / Self Care | Payer: Managed Care, Other (non HMO) | Source: Ambulatory Visit | Attending: Obstetrics and Gynecology | Admitting: Obstetrics and Gynecology

## 2014-10-10 DIAGNOSIS — N644 Mastodynia: Secondary | ICD-10-CM

## 2014-12-11 ENCOUNTER — Emergency Department (HOSPITAL_COMMUNITY)
Admission: EM | Admit: 2014-12-11 | Discharge: 2014-12-11 | Disposition: A | Discharge status: Home / Self Care | Payer: Managed Care, Other (non HMO) | Attending: Emergency Medicine | Admitting: Emergency Medicine

## 2014-12-11 ENCOUNTER — Encounter (HOSPITAL_COMMUNITY): Payer: Self-pay | Admitting: Emergency Medicine

## 2014-12-11 ENCOUNTER — Emergency Department (HOSPITAL_COMMUNITY): Payer: Managed Care, Other (non HMO)

## 2014-12-11 DIAGNOSIS — F329 Major depressive disorder, single episode, unspecified: Secondary | ICD-10-CM | POA: Insufficient documentation

## 2014-12-11 DIAGNOSIS — R6883 Chills (without fever): Secondary | ICD-10-CM | POA: Insufficient documentation

## 2014-12-11 DIAGNOSIS — F419 Anxiety disorder, unspecified: Secondary | ICD-10-CM | POA: Insufficient documentation

## 2014-12-11 DIAGNOSIS — M1990 Unspecified osteoarthritis, unspecified site: Secondary | ICD-10-CM | POA: Diagnosis not present

## 2014-12-11 DIAGNOSIS — Z8719 Personal history of other diseases of the digestive system: Secondary | ICD-10-CM | POA: Insufficient documentation

## 2014-12-11 DIAGNOSIS — R059 Cough, unspecified: Secondary | ICD-10-CM

## 2014-12-11 DIAGNOSIS — R21 Rash and other nonspecific skin eruption: Secondary | ICD-10-CM | POA: Diagnosis not present

## 2014-12-11 DIAGNOSIS — Z8679 Personal history of other diseases of the circulatory system: Secondary | ICD-10-CM | POA: Insufficient documentation

## 2014-12-11 DIAGNOSIS — R05 Cough: Secondary | ICD-10-CM | POA: Insufficient documentation

## 2014-12-11 DIAGNOSIS — F429 Obsessive-compulsive disorder, unspecified: Secondary | ICD-10-CM | POA: Diagnosis not present

## 2014-12-11 DIAGNOSIS — Z79899 Other long term (current) drug therapy: Secondary | ICD-10-CM | POA: Diagnosis not present

## 2014-12-11 HISTORY — DX: Chronic cough: R05.3

## 2014-12-11 HISTORY — DX: Ventricular premature depolarization: I49.3

## 2014-12-11 HISTORY — DX: Cough: R05

## 2014-12-11 MED ORDER — HYDROXYZINE HCL 25 MG PO TABS: 25.0000 mg | ORAL_TABLET | Freq: Four times a day (QID) | ORAL | Status: DC

## 2014-12-11 MED ORDER — PREDNISONE 20 MG PO TABS
ORAL_TABLET | ORAL | Status: AC
  Filled 2014-12-11: qty 3

## 2014-12-11 MED ORDER — HYDROXYZINE HCL 25 MG PO TABS
25.0000 mg | ORAL_TABLET | Freq: Once | ORAL | Status: AC
  Administered 2014-12-11: 25 mg via ORAL

## 2014-12-11 MED ORDER — PREDNISONE 20 MG PO TABS: 60.0000 mg | ORAL_TABLET | Freq: Every day | ORAL | Status: DC

## 2014-12-11 MED ORDER — PREDNISONE 20 MG PO TABS
60.0000 mg | ORAL_TABLET | Freq: Once | ORAL | Status: AC
  Administered 2014-12-11: 60 mg via ORAL

## 2014-12-11 MED ORDER — HYDROXYZINE HCL 25 MG PO TABS
ORAL_TABLET | ORAL | Status: AC
  Filled 2014-12-11: qty 1

## 2014-12-11 NOTE — Discharge Instructions (Signed)

## 2014-12-11 NOTE — ED Provider Notes (Signed)
CSN: [id number]     Arrival date & time 12/11/14  0217 History   First MD Initiated Contact with Patient 12/11/14 [phone]     Chief Complaint  Patient presents with  . Rash     (Consider location/radiation/quality/duration/timing/severity/associated sxs/prior Treatment) Patient is a 29 y.o. female presenting with rash. The history is provided by the patient. No language interpreter was used.  Rash Location:  Full body Quality: itchiness and redness   Severity:  Moderate Onset quality:  Gradual Duration:  1 day Timing:  Constant Progression:  Worsening Associated symptoms: no abdominal pain, no fever and not vomiting   Associated symptoms comment:  Patient presents for evaluation of a rash that developed yesterday, progressive through the night. ED visit prompted by the sense of throat swelling which has resolved since arrival. She took a Zyrtec at home without relief of symptoms of rash or itching. No SOB. She has a cough that is chronic and under the care of her doctor. She received IM steroids and a Z-Pack at a recent doctor's visit 5 days ago. Rash is generalized and includes mild facial swelling around her eyes.   Past Medical History  Diagnosis Date  . Anxiety   . Depression   . Irritable bowel syndrome (IBS)   . UTI (urinary tract infection) during pregnancy   . Arthritis   . OCD (obsessive compulsive disorder)   . Chronic cough   . PVC (premature ventricular contraction)    Past Surgical History  Procedure Laterality Date  . Wisdom tooth extraction     Family History  Problem Relation Age of Onset  . Hypertension Mother   . Hyperlipidemia Mother   . Arthritis Maternal Grandmother   . Hyperlipidemia Maternal Grandmother   . Hypertension Maternal Grandmother    Social History  Substance Use Topics  . Smoking status: Never Smoker   . Smokeless tobacco: Never Used  . Alcohol Use: Yes     Comment: occ   OB History    Gravida Para Term Preterm AB TAB SAB Ectopic  Multiple Living   2 1 1  1  1   1      Review of Systems  Constitutional: Positive for chills. Negative for fever.  HENT: Negative.   Respiratory: Positive for cough.   Cardiovascular: Negative.   Gastrointestinal: Negative.  Negative for vomiting and abdominal pain.  Musculoskeletal: Negative.   Skin: Positive for rash.  Neurological: Negative.       Allergies  Benadryl; Sulfa antibiotics; and Terconazole  Home Medications   Prior to Admission medications   Medication Sig Start Date End Date Taking? Authorizing Provider  calcium carbonate (TUMS - DOSED IN MG ELEMENTAL CALCIUM) 500 MG chewable tablet Chew 1 tablet by mouth daily.    Historical Provider, MD  famotidine (PEPCID) 20 MG tablet One at bedtime 10/04/13   Tanda Rockers, MD  ibuprofen (ADVIL,MOTRIN) 200 MG tablet Take 400 mg by mouth every 6 (six) hours as needed.    Historical Provider, MD  pantoprazole (PROTONIX) 40 MG tablet Take 1 tablet 30-60 minutes before first meal of the day. 10/06/13   Tanda Rockers, MD  predniSONE (DELTASONE) 10 MG tablet Take  4 each am x 2 days,   2 each am x 2 days,  1 each am x 2 days and stop 10/04/13   Tanda Rockers, MD  sertraline (ZOLOFT) 100 MG tablet Take 200 mg by mouth daily.    Historical Provider, MD  traMADol (ULTRAM) 50 MG  tablet 1-2 every 4 hours as needed for cough or pain 10/04/13   Tanda Rockers, MD   BP 141/93 mmHg  Pulse 107  Temp(Src) 97.7 F (36.5 C) (Oral)  Resp 19  Ht 5' 7"  (1.702 m)  Wt 154 lb (69.854 kg)  BMI 24.11 kg/m2  SpO2 97%  LMP 12/04/2014 (Approximate) Physical Exam  Constitutional: She is oriented to person, place, and time. She appears well-developed and well-nourished.  HENT:  Head: Normocephalic.  No facial swelling.  Neck: Normal range of motion. Neck supple.  Cardiovascular: Normal rate and regular rhythm.   Pulmonary/Chest: Effort normal and breath sounds normal. She has no wheezes. She has no rales.  Abdominal: Soft. Bowel sounds are  normal. There is no tenderness. There is no rebound and no guarding.  Musculoskeletal: Normal range of motion.  Neurological: She is alert and oriented to person, place, and time.  Skin: Skin is warm and dry. No rash noted.  Red, non-raised rash in generalized distribution, in larger patches of extensor surfaces of knee and elbows.   Psychiatric: She has a normal mood and affect.    ED Course  Procedures (including critical care time) Labs Review Labs Reviewed - No data to display  Imaging Review No results found. I have personally reviewed and evaluated these images and lab results as part of my medical decision-making.   EKG Interpretation None      MDM   Final diagnoses:  None    1. Nonspecific rash  Patient is evaluated by Dr. Dina Rich. Rash felt to be viral vs reactive given appearance. Atarax provided for itching. Prednisone started. Re-evaluation: she is feeling improved with less itching and improved facial swelling. CXR pending to evaluation left lower chest discomfort with cough. Plan to discharge home pending results CXR.    Charlann Lange, PA-C 12/11/14 [id number]  Merryl Hacker, MD 12/17/14 [phone]

## 2014-12-11 NOTE — ED Notes (Signed)
Patient reports battling a viral illness for @ 1 week, with chronic cough x 2 years. Patient states on Thursday (12/07/2014) she saw her PCP and was given a prednisone shot and started on Zpack. Patient states she has one more pill left. Today at @1300  patient noticed little red dots, thought she had been bit by something. Patient states now she has a rash on her entire body. Patient denies any noticeable fevers at home. Patient took Zyrtec at 0000 without relief.

## 2014-12-11 NOTE — ED Notes (Signed)
Patient transported to X-ray 

## 2015-01-27 ENCOUNTER — Emergency Department (HOSPITAL_COMMUNITY): Payer: Managed Care, Other (non HMO)

## 2015-01-27 ENCOUNTER — Emergency Department (HOSPITAL_COMMUNITY)
Admission: EM | Admit: 2015-01-27 | Discharge: 2015-01-27 | Disposition: A | Discharge status: Home / Self Care | Payer: Managed Care, Other (non HMO) | Attending: Emergency Medicine | Admitting: Emergency Medicine

## 2015-01-27 ENCOUNTER — Encounter (HOSPITAL_COMMUNITY): Payer: Self-pay | Admitting: Emergency Medicine

## 2015-01-27 DIAGNOSIS — Z87440 Personal history of urinary (tract) infections: Secondary | ICD-10-CM | POA: Insufficient documentation

## 2015-01-27 DIAGNOSIS — G8929 Other chronic pain: Secondary | ICD-10-CM | POA: Diagnosis not present

## 2015-01-27 DIAGNOSIS — F419 Anxiety disorder, unspecified: Secondary | ICD-10-CM | POA: Diagnosis not present

## 2015-01-27 DIAGNOSIS — Z7982 Long term (current) use of aspirin: Secondary | ICD-10-CM | POA: Diagnosis not present

## 2015-01-27 DIAGNOSIS — Z79899 Other long term (current) drug therapy: Secondary | ICD-10-CM | POA: Diagnosis not present

## 2015-01-27 DIAGNOSIS — R197 Diarrhea, unspecified: Secondary | ICD-10-CM | POA: Insufficient documentation

## 2015-01-27 DIAGNOSIS — N898 Other specified noninflammatory disorders of vagina: Secondary | ICD-10-CM | POA: Diagnosis not present

## 2015-01-27 DIAGNOSIS — R1110 Vomiting, unspecified: Secondary | ICD-10-CM | POA: Diagnosis not present

## 2015-01-27 DIAGNOSIS — M1990 Unspecified osteoarthritis, unspecified site: Secondary | ICD-10-CM | POA: Diagnosis not present

## 2015-01-27 DIAGNOSIS — R1031 Right lower quadrant pain: Secondary | ICD-10-CM | POA: Insufficient documentation

## 2015-01-27 DIAGNOSIS — R630 Anorexia: Secondary | ICD-10-CM | POA: Diagnosis not present

## 2015-01-27 DIAGNOSIS — F429 Obsessive-compulsive disorder, unspecified: Secondary | ICD-10-CM | POA: Diagnosis not present

## 2015-01-27 DIAGNOSIS — K589 Irritable bowel syndrome without diarrhea: Secondary | ICD-10-CM | POA: Diagnosis not present

## 2015-01-27 DIAGNOSIS — Z8679 Personal history of other diseases of the circulatory system: Secondary | ICD-10-CM | POA: Insufficient documentation

## 2015-01-27 DIAGNOSIS — R102 Pelvic and perineal pain: Secondary | ICD-10-CM | POA: Insufficient documentation

## 2015-01-27 DIAGNOSIS — F329 Major depressive disorder, single episode, unspecified: Secondary | ICD-10-CM | POA: Insufficient documentation

## 2015-01-27 DIAGNOSIS — M545 Low back pain: Secondary | ICD-10-CM | POA: Insufficient documentation

## 2015-01-27 DIAGNOSIS — R634 Abnormal weight loss: Secondary | ICD-10-CM | POA: Diagnosis not present

## 2015-01-27 DIAGNOSIS — R52 Pain, unspecified: Secondary | ICD-10-CM

## 2015-01-27 LAB — CBC WITH DIFFERENTIAL/PLATELET
BASOS ABS: 0 10*3/uL (ref 0.0–0.1)
BASOS PCT: 0 %
Eosinophils Absolute: 0 10*3/uL (ref 0.0–0.7)
Eosinophils Relative: 0 %
HEMATOCRIT: 36 % (ref 36.0–46.0)
HEMOGLOBIN: 11.6 g/dL — AB (ref 12.0–15.0)
LYMPHS PCT: 19 %
Lymphs Abs: 1.8 10*3/uL (ref 0.7–4.0)
MCH: 24.8 pg — ABNORMAL LOW (ref 26.0–34.0)
MCHC: 32.2 g/dL (ref 30.0–36.0)
MCV: 77.1 fL — AB (ref 78.0–100.0)
Monocytes Absolute: 0.8 10*3/uL (ref 0.1–1.0)
Monocytes Relative: 9 %
NEUTROS ABS: 6.6 10*3/uL (ref 1.7–7.7)
NEUTROS PCT: 72 %
Platelets: 280 10*3/uL (ref 150–400)
RBC: 4.67 MIL/uL (ref 3.87–5.11)
RDW: 15 % (ref 11.5–15.5)
WBC: 9.3 10*3/uL (ref 4.0–10.5)

## 2015-01-27 LAB — COMPREHENSIVE METABOLIC PANEL
ALBUMIN: 4.2 g/dL (ref 3.5–5.0)
ALK PHOS: 59 U/L (ref 38–126)
ALT: 14 U/L (ref 14–54)
AST: 15 U/L (ref 15–41)
Anion gap: 10 (ref 5–15)
BILIRUBIN TOTAL: 0.4 mg/dL (ref 0.3–1.2)
BUN: 12 mg/dL (ref 6–20)
CO2: 23 mmol/L (ref 22–32)
Calcium: 9.4 mg/dL (ref 8.9–10.3)
Chloride: 104 mmol/L (ref 101–111)
Creatinine, Ser: 0.61 mg/dL (ref 0.44–1.00)
GFR calc Af Amer: >60 mL/min (ref >60)
GFR calc non Af Amer: >60 mL/min (ref >60)
GLUCOSE: 115 mg/dL — AB (ref 65–99)
POTASSIUM: 3.5 mmol/L (ref 3.5–5.1)
Sodium: 137 mmol/L (ref 135–145)
Total Protein: 8 g/dL (ref 6.5–8.1)

## 2015-01-27 LAB — URINALYSIS, ROUTINE W REFLEX MICROSCOPIC
Bilirubin Urine: NEGATIVE
Glucose, UA: NEGATIVE mg/dL
LEUKOCYTES UA: NEGATIVE
NITRITE: NEGATIVE
PH: 7 (ref 5.0–8.0)
Specific Gravity, Urine: 1.02 (ref 1.005–1.030)

## 2015-01-27 LAB — URINE MICROSCOPIC-ADD ON

## 2015-01-27 LAB — WET PREP, GENITAL
Clue Cells Wet Prep HPF POC: NONE SEEN
Sperm: NONE SEEN
TRICH WET PREP: NONE SEEN
Yeast Wet Prep HPF POC: NONE SEEN

## 2015-01-27 LAB — I-STAT BETA HCG BLOOD, ED (MC, WL, AP ONLY)

## 2015-01-27 MED ORDER — SODIUM CHLORIDE 0.9 % IV BOLUS (SEPSIS)
1000.0000 mL | Freq: Once | INTRAVENOUS | Status: AC
  Administered 2015-01-27: 1000 mL via INTRAVENOUS

## 2015-01-27 MED ORDER — SODIUM CHLORIDE 0.9 % IJ SOLN
INTRAMUSCULAR | Status: AC
  Filled 2015-01-27: qty 500

## 2015-01-27 MED ORDER — IOHEXOL 300 MG/ML  SOLN
100.0000 mL | Freq: Once | INTRAMUSCULAR | Status: AC | PRN
  Administered 2015-01-27: 100 mL via INTRAVENOUS

## 2015-01-27 MED ORDER — IOHEXOL 300 MG/ML  SOLN
25.0000 mL | Freq: Once | INTRAMUSCULAR | Status: AC | PRN
  Administered 2015-01-27: 25 mL via ORAL

## 2015-01-27 MED ORDER — KETOROLAC TROMETHAMINE 30 MG/ML IJ SOLN
30.0000 mg | Freq: Once | INTRAMUSCULAR | Status: AC
  Administered 2015-01-27: 30 mg via INTRAVENOUS
  Filled 2015-01-27: qty 1

## 2015-01-27 MED ORDER — SODIUM CHLORIDE 0.9 % IJ SOLN
INTRAMUSCULAR | Status: AC
  Filled 2015-01-27: qty 30

## 2015-01-27 NOTE — Discharge Instructions (Signed)
Your work-up today did not show anything serious. Please follow-up with your primary care provider regarding further work-up and management of your symptoms. Follow-up with GI as scheduled next week. Return for worsening symptoms, including fevers, vomiting and unable to keep down food/fluids, or any other symptoms concerning to you.  Abdominal Pain, Adult Many things can cause abdominal pain. Usually, abdominal pain is not caused by a disease and will improve without treatment. It can often be observed and treated at home. Your health care provider will do a physical exam and possibly order blood tests and X-rays to help determine the seriousness of your pain. However, in many cases, more time must pass before a clear cause of the pain can be found. Before that point, your health care provider may not know if you need more testing or further treatment. HOME CARE INSTRUCTIONS Monitor your abdominal pain for any changes. The following actions may help to alleviate any discomfort you are experiencing:  Only take over-the-counter or prescription medicines as directed by your health care provider.  Do not take laxatives unless directed to do so by your health care provider.  Try a clear liquid diet (broth, tea, or water) as directed by your health care provider. Slowly move to a bland diet as tolerated. SEEK MEDICAL CARE IF:  You have unexplained abdominal pain.  You have abdominal pain associated with nausea or diarrhea.  You have pain when you urinate or have a bowel movement.  You experience abdominal pain that wakes you in the night.  You have abdominal pain that is worsened or improved by eating food.  You have abdominal pain that is worsened with eating fatty foods.  You have a fever. SEEK IMMEDIATE MEDICAL CARE IF:  Your pain does not go away within 2 hours.  You keep throwing up (vomiting).  Your pain is felt only in portions of the abdomen, such as the right side or the left  lower portion of the abdomen.  You pass bloody or black tarry stools. MAKE SURE YOU:  Understand these instructions.  Will watch your condition.  Will get help right away if you are not doing well or get worse.   This information is not intended to replace advice given to you by your health care provider. Make sure you discuss any questions you have with your health care provider.   Document Released: 11/13/2004 Document Revised: 10/25/2014 Document Reviewed: 10/13/2012 Elsevier Interactive Patient Education [employer].

## 2015-01-27 NOTE — ED Notes (Signed)
PT c/o right lower pelvic pain radiating into her back and states is one Macrobid daily for recurrent UTIs. PT c/o nausea with vomiting while eating and generalized weakness. PT states recent antibiotic therapy x3 from urologist and last urine culture was negative. PT states burning with urination.

## 2015-01-27 NOTE — ED Provider Notes (Signed)
CSN: [id number]     Arrival date & time 01/27/15  1357 History   First MD Initiated Contact with Patient 01/27/15 1539     Chief Complaint  Patient presents with  . Pelvic Pain     (Consider location/radiation/quality/duration/timing/severity/associated sxs/prior Treatment) HPI 29 year old female with right lower quadrant/pelvic pain. She has a history of IBS, anxiety, depression. States that she has had long-standing right-sided pelvic pain for now almost 1 year, that waxes and wanes in severity. States that more recently she has noticed some weight loss and decreased appetite. She has had intermittent diarrhea with vomiting. She has had persistent dysuria despite treatment with multiple courses of antibiotics, including Macrobid currently. She has not had any abnormal vaginal bleeding, but does complain of recent foul-smelling discharge. Is married, not concerning for any sexually transmitted illnesses. Denies any nausea, vomiting, fevers, melena, or hematochezia. Has also noted some increasing low back pain recently. States that with her recent decreased appetite, weight loss, and chronic pain she was concerned about underlying malignancy. States that she is to follow-up with gastroenterology this week for evaluation, but states that she could not wait that long as her pain in her abdomen seemed to be worse. No strong family history of gynecological or GI cancer.     Past Medical History  Diagnosis Date  . Anxiety   . Depression   . Irritable bowel syndrome (IBS)   . UTI (urinary tract infection) during pregnancy   . Arthritis   . OCD (obsessive compulsive disorder)   . Chronic cough   . PVC (premature ventricular contraction)    Past Surgical History  Procedure Laterality Date  . Wisdom tooth extraction     Family History  Problem Relation Age of Onset  . Hypertension Mother   . Hyperlipidemia Mother   . Arthritis Maternal Grandmother   . Hyperlipidemia Maternal Grandmother    . Hypertension Maternal Grandmother    Social History  Substance Use Topics  . Smoking status: Never Smoker   . Smokeless tobacco: Never Used  . Alcohol Use: Yes     Comment: occ   OB History    Gravida Para Term Preterm AB TAB SAB Ectopic Multiple Living   2 1 1  1  1   1      Review of Systems 10/14 systems reviewed and are negative other than those stated in the HPI   Allergies  Azithromycin; Benadryl; Sulfa antibiotics; and Terconazole  Home Medications   Prior to Admission medications   Medication Sig Start Date End Date Taking? Authorizing Provider  ASPIRIN PO Take 1 tablet by mouth once as needed (for pain).   Yes Historical Provider, MD  cetirizine (ZYRTEC) 10 MG tablet Take 10 mg by mouth daily as needed for allergies.    Yes Historical Provider, MD  Linaclotide Rolan Lipa) 145 MCG CAPS capsule Take 145 mcg by mouth once.   Yes Historical Provider, MD  nitrofurantoin (MACRODANTIN) 100 MG capsule Take 100 mg by mouth daily. 6 week course 01/22/15  Yes Historical Provider, MD  sertraline (ZOLOFT) 100 MG tablet Take 200 mg by mouth daily.   Yes Historical Provider, MD  azithromycin (ZITHROMAX) 250 MG tablet Take 250-500 mg by mouth daily. 500 mg first dose then take 250 mg for 4 days start 12/07/14 12/07/14   Historical Provider, MD  hydrOXYzine (ATARAX/VISTARIL) 25 MG tablet Take 1 tablet (25 mg total) by mouth every 6 (six) hours. Patient not taking: Reported on 01/27/2015 12/11/14   Charlann Lange, PA-C  predniSONE (DELTASONE) 20 MG tablet Take 3 tablets (60 mg total) by mouth daily. Patient not taking: Reported on 01/27/2015 12/11/14   Charlann Lange, PA-C   BP 143/94 mmHg  Pulse 94  Temp(Src) 98.4 F (36.9 C) (Oral)  Resp 16  Ht 5' 7"  (3.532 m)  Wt 151 lb 12.8 oz (68.856 kg)  BMI 23.77 kg/m2  SpO2 98%  LMP 01/11/2015 Physical Exam Physical Exam  Nursing note and vitals reviewed. Constitutional: Well developed, well nourished, non-toxic, and in no acute distress  appears anxious Head: Normocephalic and atraumatic.  Mouth/Throat: Oropharynx is clear and moist.  Neck: Normal range of motion. Neck supple.  Cardiovascular: Normal rate and regular rhythm.   Pulmonary/Chest: Effort normal and breath sounds normal.  Abdominal: Soft. There is no tenderness. There is no rebound and no guarding. No CVA tenderness. Pelvic: Normal external genitalia. Normal internal genitalia. Mild white discharge. No blood within the vagina. No cervical motion tenderness. No adnexal masses. Right adnexal tenderness. Musculoskeletal: Normal range of motion.  Neurological: Alert, no facial droop, fluent speech, moves all extremities symmetrically Skin: Skin is warm and dry.  Psychiatric: Cooperative   ED Course  Procedures (including critical care time) Labs Review Labs Reviewed  WET PREP, GENITAL - Abnormal; Notable for the following:    WBC, Wet Prep HPF POC FEW (*)    All other components within normal limits  URINALYSIS, ROUTINE W REFLEX MICROSCOPIC (NOT AT [facility]) - Abnormal; Notable for the following:    Hgb urine dipstick TRACE (*)    Ketones, ur TRACE (*)    Protein, ur TRACE (*)    All other components within normal limits  CBC WITH DIFFERENTIAL/PLATELET - Abnormal; Notable for the following:    Hemoglobin 11.6 (*)    MCV 77.1 (*)    MCH 24.8 (*)    All other components within normal limits  COMPREHENSIVE METABOLIC PANEL - Abnormal; Notable for the following:    Glucose, Bld 115 (*)    All other components within normal limits  URINE MICROSCOPIC-ADD ON - Abnormal; Notable for the following:    Squamous Epithelial / LPF 0-5 (*)    Bacteria, UA FEW (*)    All other components within normal limits  RPR  HIV ANTIBODY (ROUTINE TESTING)  I-STAT BETA HCG BLOOD, ED ([facility], [facility], [facility] ONLY)  GC/CHLAMYDIA PROBE AMP ([place]) NOT AT [facility]    Imaging Review Ct Abdomen Pelvis W Contrast  01/27/2015  CLINICAL DATA:  Right lower abdominal pelvic pain radiating into  back, history of urinary tract infections, nausea and vomiting, recent antibiotic therapy for urinary tract infection, burning with urination EXAM: CT ABDOMEN AND PELVIS WITH CONTRAST TECHNIQUE: Multidetector CT imaging of the abdomen and pelvis was performed using the standard protocol following bolus administration of intravenous contrast. CONTRAST:  117m OMNIPAQUE IOHEXOL 300 MG/ML  SOLN COMPARISON:  08/01/2014 ultrasound FINDINGS: Lower chest:  Negative Hepatobiliary: Negative Pancreas: Negative Spleen: Neck.  Set for small spleen Adrenals/Urinary Tract: Kidneys are normal with no perinephric inflammatory change. No hydronephrosis. Bladder normal. Stomach/Bowel: Small bowel stomach large bowel and appendix are normal. Vascular/Lymphatic: No abnormalities involving the aortoiliac vessels. No significant adenopathy. Reproductive: Uterus and right ovary are normal. Left ovary demonstrates a 2.7 cm low-attenuation structure with peripheral crenulated enhancement. Other: Small volume free fluid in the cul-de-sac and left adnexa Musculoskeletal: No acute findings IMPRESSION: Findings appear most consistent with ruptured cyst left ovary associated with small volume free fluid in the pelvis. Electronically Signed   By:  Skipper Cliche M.D.   On: 01/27/2015 19:16   I have personally reviewed and evaluated these images and lab results as part of my medical decision-making.   MDM   Final diagnoses:  Pain  Right lower quadrant abdominal pain    In short, this is a 29 year old female who presents with long-standing right lower quadrant/pelvic pain with weight loss, decreased appetite, and multiple other complaints. She is nontoxic and in no acute distress, but appears very anxious and is tearful on exam and she states that she is terrified that she may have an underlying malignancy. Her vital signs are non-concerning in her abdomen is soft and benign. There is some focal right lower quadrant and right adnexal  tenderness to palpation. Basic blood work including CBC, comprehensive metabolic panel, urinalysis are unremarkable. Pelvic exam is performed, and wet prep shows no significant underlying infection. Gonorrhea and chlamydia cultures are also sent, but given that she is not concerned for underlying STD we will not empirically treat her and await culture results. A CT abdomen pelvis is performed to evaluate for ovarian mass versus torsion versus malignancy or other acute intra-abdominal process. This shows no acute processes, but possible ruptured left ovarian cyst. Discussed findings with her and encouraged her to follow up with her primary care provider and GI doctor as scheduled. Strict return and follow-up instructions are reviewed. She expressed understanding of all discharge instructions and felt comfortable with the plan of care.    Forde Dandy, MD 01/27/15 2045

## 2015-01-29 LAB — GC/CHLAMYDIA PROBE AMP (~~LOC~~) NOT AT ARMC
CHLAMYDIA, DNA PROBE: NEGATIVE
Neisseria Gonorrhea: NEGATIVE

## 2015-01-29 LAB — HIV ANTIBODY (ROUTINE TESTING W REFLEX): HIV Screen 4th Generation wRfx: NONREACTIVE

## 2015-01-29 LAB — RPR: RPR: NONREACTIVE

## 2016-07-30 ENCOUNTER — Other Ambulatory Visit: Payer: Self-pay | Admitting: Obstetrics & Gynecology

## 2016-07-30 DIAGNOSIS — R102 Pelvic and perineal pain: Secondary | ICD-10-CM

## 2016-08-04 ENCOUNTER — Other Ambulatory Visit: Payer: Managed Care, Other (non HMO)

## 2016-08-05 ENCOUNTER — Ambulatory Visit
Admission: RE | Admit: 2016-08-05 | Discharge: 2016-08-05 | Disposition: A | Discharge status: Home / Self Care | Payer: Managed Care, Other (non HMO) | Source: Ambulatory Visit | Attending: Obstetrics & Gynecology | Admitting: Obstetrics & Gynecology

## 2016-08-05 DIAGNOSIS — R102 Pelvic and perineal pain: Secondary | ICD-10-CM

## 2017-05-25 ENCOUNTER — Other Ambulatory Visit: Payer: Self-pay | Admitting: Internal Medicine

## 2017-05-25 ENCOUNTER — Ambulatory Visit
Admission: RE | Admit: 2017-05-25 | Discharge: 2017-05-25 | Disposition: A | Discharge status: Home / Self Care | Payer: Managed Care, Other (non HMO) | Source: Ambulatory Visit | Attending: Internal Medicine | Admitting: Internal Medicine

## 2017-05-25 DIAGNOSIS — R05 Cough: Secondary | ICD-10-CM

## 2017-05-25 DIAGNOSIS — R059 Cough, unspecified: Secondary | ICD-10-CM

## 2017-05-25 DIAGNOSIS — R509 Fever, unspecified: Secondary | ICD-10-CM

## 2017-06-03 ENCOUNTER — Encounter: Payer: Self-pay | Admitting: Internal Medicine

## 2017-06-03 ENCOUNTER — Ambulatory Visit (INDEPENDENT_AMBULATORY_CARE_PROVIDER_SITE_OTHER): Payer: 59 | Admitting: Internal Medicine

## 2017-06-03 ENCOUNTER — Telehealth: Payer: Self-pay

## 2017-06-03 DIAGNOSIS — N3010 Interstitial cystitis (chronic) without hematuria: Secondary | ICD-10-CM | POA: Diagnosis not present

## 2017-06-03 DIAGNOSIS — R300 Dysuria: Secondary | ICD-10-CM | POA: Diagnosis not present

## 2017-06-03 DIAGNOSIS — G8929 Other chronic pain: Secondary | ICD-10-CM

## 2017-06-03 DIAGNOSIS — K529 Noninfective gastroenteritis and colitis, unspecified: Secondary | ICD-10-CM

## 2017-06-03 DIAGNOSIS — G4733 Obstructive sleep apnea (adult) (pediatric): Secondary | ICD-10-CM

## 2017-06-03 DIAGNOSIS — F419 Anxiety disorder, unspecified: Secondary | ICD-10-CM | POA: Diagnosis not present

## 2017-06-03 DIAGNOSIS — J302 Other seasonal allergic rhinitis: Secondary | ICD-10-CM

## 2017-06-03 DIAGNOSIS — F331 Major depressive disorder, recurrent, moderate: Secondary | ICD-10-CM

## 2017-06-03 DIAGNOSIS — F329 Major depressive disorder, single episode, unspecified: Secondary | ICD-10-CM | POA: Insufficient documentation

## 2017-06-03 DIAGNOSIS — R109 Unspecified abdominal pain: Secondary | ICD-10-CM

## 2017-06-03 DIAGNOSIS — F32A Depression, unspecified: Secondary | ICD-10-CM | POA: Insufficient documentation

## 2017-06-03 NOTE — Progress Notes (Signed)
[place] for Infectious Disease  Reason for Consult: Possible fever of unknown origin Referring Physician: Dr. Jilda Panda   Assessment: I do not find any evidence of active infection and doubt that this is a true fever of unknown origin.  She may have had a minor illness to begin with but I think the major problem now is extreme anxiety.  She says that normally she will take long walks to help deal with her anxiety.  She also says that talking with her counselor can help.  I suggested that she start to get more exercise and I asked her to call her counselor today.  Given her history of dysuria and frequency I will check a urinalysis and urine culture today.  I will see her back in 2-3 weeks.  Plan: 1. UA and urine culture 2. Increase exercise 3. She will call her counselor today 4. Follow-up in 2-3 weeks  Patient Active Problem List   Diagnosis Date Noted  . Chronic abdominal pain 06/03/2017    Priority: High  . Dysuria 06/03/2017  . Chronic diarrhea 06/03/2017  . Obstructive sleep apnea 06/03/2017  . Seasonal allergies 06/03/2017  . Anxiety 06/03/2017  . Depression 06/03/2017  . Interstitial cystitis 06/03/2017    Patient's Medications  New Prescriptions   No medications on file  Previous Medications   ASPIRIN PO    Take 1 tablet by mouth once as needed (for pain).   SERTRALINE (ZOLOFT) 100 MG TABLET    Take 200 mg by mouth daily.   VITAMIN D, CHOLECALCIFEROL, PO    Take by mouth.  Modified Medications   No medications on file  Discontinued Medications   AZITHROMYCIN (ZITHROMAX) 250 MG TABLET    Take 250-500 mg by mouth daily. 500 mg first dose then take 250 mg for 4 days start 12/07/14   CETIRIZINE (ZYRTEC) 10 MG TABLET    Take 10 mg by mouth daily as needed for allergies.    HYDROXYZINE (ATARAX/VISTARIL) 25 MG TABLET    Take 1 tablet (25 mg total) by mouth every 6 (six) hours.   LINACLOTIDE (LINZESS) 145 MCG CAPS CAPSULE    Take 145 mcg by mouth once.     NITROFURANTOIN (MACRODANTIN) 100 MG CAPSULE    Take 100 mg by mouth daily. 6 week course   PREDNISONE (DELTASONE) 20 MG TABLET    Take 3 tablets (60 mg total) by mouth daily.    HPI: Gabriela Moses is a 32 y.o. female who tells me that she had salmonellosis in 2017.  She was treated with ciprofloxacin.  Her diarrhea improved but she has had left lower quadrant ever since that time.  She says that several weeks ago she got much worse.  She says that she has had fever although her highest recorded temperature is 99.9 degrees.  She has developed diffuse pain in her abdomen hands, arms, feet, chest, back and neck.  She is having headaches.  She says that she has been extremely fatigued and frequently breaks out in a sweat.  She had a transient rash on her arms and legs that itch.  She has pictures on her cell phone did show dime size red spots on her legs and arms.  The rash resolved after she received dexamethasone 8 mg IM on 05/18/2017.  She did not note improvement in her other symptoms. She describes frequency and dysuria.  She is also having a scratchy throat and dry cough.  She has  been bothered by chronic diarrhea but also has had recent nausea, vomiting, gas and intermittent constipation.  She has a history of anxiety and depression.  She usually sees her counselor every 2 weeks but has not seen her counselor since she got worse several weeks ago.  She tells me that whenever she gets sick she gets extremely anxious.  She traces this back to the death of her stepfather when she was 17 years old.  He died of lymphoma.  She says that she has been searching her symptoms on Google and has been very concerned that she might have cancer.  She is worried about lymphoma, colon cancer and ovarian cancer.  She has seen numerous specialists over the past few years including pulmonary, ENT, gastroenterology, GYN, and urology.  She carries diagnoses of obstructive sleep apnea, irritable bowel syndrome and  interstitial cystitis.  Dr. Mellody Drown has already done a fairly extensive evaluation.  All lab work has been unremarkable except for a slightly elevated C-reactive protein.  Blood and stool cultures have been negative.  Chest x-ray showed no acute abnormalities.  She is happily married.  She has no children.  She works at a [employer] systems [employer] called, [employer].   Review of Systems: Review of Systems  Constitutional: Positive for diaphoresis, fever, malaise/fatigue and weight loss. Negative for chills.  HENT: Positive for sore throat. Negative for congestion.   Eyes: Negative for blurred vision.  Respiratory: Positive for cough and shortness of breath. Negative for wheezing.   Cardiovascular: Positive for palpitations. Negative for chest pain.  Gastrointestinal: Positive for abdominal pain, constipation, diarrhea, nausea and vomiting. Negative for heartburn.  Genitourinary: Positive for dysuria and frequency.  Musculoskeletal: Positive for back pain, joint pain and myalgias.  Skin: Positive for itching and rash.  Neurological: Positive for headaches. Negative for dizziness.  Psychiatric/Behavioral: Positive for depression. Negative for substance abuse and suicidal ideas. The patient is nervous/anxious.       Past Medical History:  Diagnosis Date  . Anxiety   . Arthritis   . Chronic cough   . Depression   . Irritable bowel syndrome (IBS)   . OCD (obsessive compulsive disorder)   . PVC (premature ventricular contraction)   . UTI (urinary tract infection) during pregnancy     Social History   Tobacco Use  . Smoking status: Never Smoker  . Smokeless tobacco: Never Used  Substance Use Topics  . Alcohol use: Yes    Comment: occ  . Drug use: No    Family History  Problem Relation Age of Onset  . Hypertension Mother   . Hyperlipidemia Mother   . Arthritis Maternal Grandmother   . Hyperlipidemia Maternal Grandmother   . Hypertension Maternal Grandmother    Allergies    Allergen Reactions  . Azithromycin   . Benadryl [Diphenhydramine Hcl] Rash    Childhood allergy  . Sulfa Antibiotics Rash    Childhood allergy  . Terconazole Rash and Other (See Comments)    Severe burning sensation with rash    OBJECTIVE: Vitals:   06/03/17 1408  BP: 122/90  Pulse: (!) 116  Temp: 98.9 F (37.2 C)  TempSrc: Oral  Weight: 159 lb (72.1 kg)  Height: 5' 7"  (1.702 m)   Body mass index is 24.9 kg/m.   Physical Exam  Constitutional: She is oriented to person, place, and time.  She is extremely anxious and apologizes frequently for being so nervous.  HENT:  Mouth/Throat: No oropharyngeal exudate.  Eyes: Conjunctivae are normal.  Neck: Neck supple.  Cardiovascular: Normal rate, regular rhythm and normal heart sounds.  No murmur heard. Pulmonary/Chest: Effort normal and breath sounds normal. She has no wheezes. She has no rales.  Abdominal: Soft. She exhibits no distension. There is tenderness. There is no guarding.  Musculoskeletal: Normal range of motion. She exhibits no edema or tenderness.  Lymphadenopathy:       Head (right side): No submandibular adenopathy present.       Head (left side): No submandibular adenopathy present.    She has no cervical adenopathy.    She has no axillary adenopathy.       Right: No inguinal adenopathy present.       Left: No inguinal adenopathy present.  Neurological: She is alert and oriented to person, place, and time.  Skin: No rash noted.    Microbiology: No results found for this or any previous visit (from the past 240 hour(s)).  Michel Bickers, MD [facility] [place] [facility] [phone] pager   [phone] cell 06/03/2017, 5:28 PM

## 2017-06-03 NOTE — Telephone Encounter (Signed)
Called pt back regarding a question she had during her visit. Pt was wondering if she should take anything to help with her back pain. Informed pt that we would call her back when we get lab results to see what her best options would be. Pt is okay with this plan for now and will follow up with Dr. Megan Salon in two weeks. Pt will call office if she has worsening pain in her back.  [place]

## 2017-06-04 ENCOUNTER — Telehealth: Payer: Self-pay | Admitting: Behavioral Health

## 2017-06-04 LAB — URINALYSIS, ROUTINE W REFLEX MICROSCOPIC
Bacteria, UA: NONE SEEN /HPF
Bilirubin Urine: NEGATIVE
Glucose, UA: NEGATIVE
HYALINE CAST: NONE SEEN /LPF
LEUKOCYTES UA: NEGATIVE
NITRITE: NEGATIVE
PH: 7 (ref 5.0–8.0)
Protein, ur: NEGATIVE
SPECIFIC GRAVITY, URINE: 1.027 (ref 1.001–1.03)

## 2017-06-04 LAB — URINE CULTURE
MICRO NUMBER: 90473239
SPECIMEN QUALITY: ADEQUATE

## 2017-06-04 NOTE — Telephone Encounter (Signed)
Called patient and informed her per Dr. Megan Salon her Urinalysis showed no evidence of a Urinary Tract infection.  Patient verbalized understanding. Pricilla Riffle RN

## 2017-06-04 NOTE — Telephone Encounter (Signed)
Patient called requesting results from Urinalysis yesterday.  Patient states she is continuing to have increase urine frequency and pain with urination.  Informed her results have not been release by the doctor.  Patient is concerned she will not have any answers going into the weekend. Pricilla Riffle RN

## 2017-06-04 NOTE — Telephone Encounter (Signed)
Please let her know that the urinalysis does not show any evidence of a urinary tract infection.

## 2017-06-08 ENCOUNTER — Telehealth: Payer: Self-pay | Admitting: *Deleted

## 2017-06-08 NOTE — Telephone Encounter (Signed)
Patient called with reports of diarrhea starting Friday (7 that morning, tapered off with 1 dose immodium), Saturday ("jagged mush", 4x), Sunday ("thin solid -> strings -> jagged mush", 4x). Same this morning. She states her anxiety has gone down, as has much of her other symptoms, except for diarrhea, nausea (with empty stomach - dry heaving, but no emesis), and reflux. Still with lower left and upper abdominal pain - feels like pressure, feels she constantly has to go to the bathroom. Patient has been able to eat (yogurt, [place] sandwich, ramen) and drink.  Please advise. Landis Gandy, RN

## 2017-06-10 ENCOUNTER — Telehealth: Payer: Self-pay

## 2017-06-10 NOTE — Telephone Encounter (Signed)
Patient called with complaints of pain in all extremities. Increased urine urgency. Moderate ketones with dipstick at home. Called primary care who informed her to increase water intake. Patient stated she did that. Symptoms returns on Monday. Went to see therapist yesterday and anxiety has decreased some. Patient just wanted Dr. Megan Salon to be aware of these things before her next visit on 06/18/2017. Gabriela Corn, LPN

## 2017-06-11 ENCOUNTER — Other Ambulatory Visit: Payer: Self-pay | Admitting: Obstetrics & Gynecology

## 2017-06-11 DIAGNOSIS — R102 Pelvic and perineal pain: Secondary | ICD-10-CM

## 2017-06-15 ENCOUNTER — Telehealth: Payer: Self-pay

## 2017-06-15 NOTE — Telephone Encounter (Signed)
Patient called wanting to inform Dr. Michel Bickers that her tongue is swollen and "scalloped around the edges". No difficulty breathing noted per patient. Symptoms first noted on 06/13/2017. Patient stated she just wanted the doctor to be informed prior to her 06/18/2017 visit. Pola Corn, LPN

## 2017-06-15 NOTE — Telephone Encounter (Signed)
She has chronic diarrhea and has been diagnosed previously with irritable bowel syndrome.  She may use Imodium as needed to help control her symptoms.

## 2017-06-18 ENCOUNTER — Encounter: Payer: Self-pay | Admitting: Internal Medicine

## 2017-06-18 ENCOUNTER — Ambulatory Visit (INDEPENDENT_AMBULATORY_CARE_PROVIDER_SITE_OTHER): Payer: 59 | Admitting: Internal Medicine

## 2017-06-18 ENCOUNTER — Other Ambulatory Visit: Payer: 59

## 2017-06-18 DIAGNOSIS — F419 Anxiety disorder, unspecified: Secondary | ICD-10-CM

## 2017-06-18 DIAGNOSIS — N3010 Interstitial cystitis (chronic) without hematuria: Secondary | ICD-10-CM

## 2017-06-18 DIAGNOSIS — G8929 Other chronic pain: Secondary | ICD-10-CM | POA: Diagnosis not present

## 2017-06-18 DIAGNOSIS — R109 Unspecified abdominal pain: Secondary | ICD-10-CM
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
## 2017-06-18 NOTE — Assessment & Plan Note (Signed)
She does not have evidence of a urinary tract infection.  Have interstitial cystitis and will need to learn to manage her symptoms as best as possible.

## 2017-06-18 NOTE — Assessment & Plan Note (Signed)
I talked to her at length about how her physical complaints and her anxiety are interconnected.  She traces a lot of her suffering back to the death of her stepfather and she may have a component of posttraumatic stress disorder.  I encouraged her to continue to work with her counselor and think about a different approach to her physical symptoms.

## 2017-06-18 NOTE — Assessment & Plan Note (Signed)
She was referred here for possible FUO but I find no evidence of fever or active infection.  She has been seeing multiple specialist in addition to her primary care provider and counselor and has been given multiple syndrome medical diagnoses.  She may very well have irritable bowel syndrome and interstitial cystitis.  I think it is best to try to shift the focus from further diagnostic testing, which is likely to make her more anxious, and focus more on managing her anxiety and symptoms.  She can follow-up here as needed.

## 2017-06-19 ENCOUNTER — Ambulatory Visit
Admission: RE | Admit: 2017-06-19 | Discharge: 2017-06-19 | Disposition: A | Discharge status: Home / Self Care | Payer: 59 | Source: Ambulatory Visit | Attending: Obstetrics & Gynecology | Admitting: Obstetrics & Gynecology

## 2017-06-19 DIAGNOSIS — R102 Pelvic and perineal pain: Secondary | ICD-10-CM

## 2018-05-07 ENCOUNTER — Telehealth: Payer: 59 | Admitting: Family

## 2018-05-07 DIAGNOSIS — R0602 Shortness of breath: Secondary | ICD-10-CM

## 2018-05-07 DIAGNOSIS — R05 Cough: Secondary | ICD-10-CM

## 2018-05-07 DIAGNOSIS — R059 Cough, unspecified: Secondary | ICD-10-CM

## 2018-05-07 MED ORDER — ALBUTEROL SULFATE HFA 108 (90 BASE) MCG/ACT IN AERS
2.0000 | INHALATION_SPRAY | Freq: Four times a day (QID) | RESPIRATORY_TRACT | 0 refills | Status: DC | PRN
Start: 2018-05-07 — End: 2020-02-01

## 2018-05-07 MED ORDER — BENZONATATE 200 MG PO CAPS: 200.0000 mg | ORAL_CAPSULE | Freq: Three times a day (TID) | ORAL | 0 refills | Status: DC | PRN

## 2018-05-07 NOTE — Progress Notes (Signed)
Greater than 5 minutes, yet less than 10 minutes of time have been spent researching, coordinating, and implementing care for this patient today.  Thank you for the details you included in the comment boxes. Those details are very helpful in determining the best course of treatment for you and help [place] to provide the best care.  E-Visit for Corona Virus Screening  Based on your current symptoms, it seems unlikely that your symptoms are related to the Coronavirus.   Coronavirus disease 2019 (COVID-19) is a respiratory illness that can spread from person to person. The virus that causes COVID-19 is a new virus that was first identified in the country of [place] but is now found in multiple other countries and has spread to the [place].  Symptoms associated with the virus are mild to severe fever, cough, and shortness of breath. There is currently no vaccine to protect against COVID-19, and there is no specific antiviral treatment for the virus.   To be considered HIGH RISK for Coronavirus (COVID-19), you have to meet the following criteria:  . Traveled to [place], [place], [place], [place] or [place]; or in the [place] to [place], [place], [place], or [place]; and have fever, cough, and shortness of breath within the last 2 weeks of travel OR  . Been in close contact with a person diagnosed with COVID-19 within the last 2 weeks and have fever, cough, and shortness of breath  . IF YOU DO NOT MEET THESE CRITERIA, YOU ARE CONSIDERED LOW RISK FOR COVID-19.   It is vitally important that if you feel that you have an infection such as this virus or any other virus that you stay home and away from places where you may spread it to others.  You should self-quarantine for 14 days if you have symptoms that could potentially be coronavirus and avoid contact with people age 33 and older.   You can use medication such as A prescription cough medication called Tessalon Perles 100 mg. You may take 1-2  capsules every 8 hours as needed for cough and A prescription inhaler called Albuterol MDI 90 mcg /actuation 2 puffs every 4 hours as needed for shortness of breath, wheezing, cough  You may also take acetaminophen (Tylenol) as needed for fever.   Reduce your risk of any infection by using the same precautions used for avoiding the common cold or flu:  Marland Kitchen Wash your hands often with soap and warm water for at least 20 seconds.  If soap and water are not readily available, use an alcohol-based hand sanitizer with at least 60% alcohol.  . If coughing or sneezing, cover your mouth and nose by coughing or sneezing into the elbow areas of your shirt or coat, into a tissue or into your sleeve (not your hands). . Avoid shaking hands with others and consider head nods or verbal greetings only. . Avoid touching your eyes, nose, or mouth with unwashed hands.  . Avoid close contact with people who are sick. . Avoid places or events with large numbers of people in one location, like concerts or sporting events. . Carefully consider travel plans you have or are making. . If you are planning any travel outside or inside the [place], visit the CDC's Travelers' Health webpage for the latest health notices. . If you have some symptoms but not all symptoms, continue to monitor at home and seek medical attention if your symptoms worsen. . If you are having a medical emergency, call 911.  HOME CARE . Only take medications as instructed by your medical team. . Drink plenty of fluids and get plenty of rest. . A steam or ultrasonic humidifier can help if you have congestion.   GET HELP RIGHT AWAY IF: . You develop worsening fever. . You become short of breath . You cough up blood. . Your symptoms become more severe MAKE SURE YOU   Understand these instructions.  Will watch your condition.  Will get help right away if you are not doing well or get worse.  Your e-visit answers were reviewed by a board certified  advanced clinical practitioner to complete your personal care plan.  Depending on the condition, your plan could have included both over the counter or prescription medications.  If there is a problem please reply once you have received a response from your provider. Your safety is important to [place].  If you have drug allergies check your prescription carefully.    You can use MyChart to ask questions about today's visit, request a non-urgent call back, or ask for a work or school excuse for 24 hours related to this e-Visit. If it has been greater than 24 hours you will need to follow up with your provider, or enter a new e-Visit to address those concerns. You will get an e-mail in the next two days asking about your experience.  I hope that your e-visit has been valuable and will speed your recovery. Thank you for using e-visits.

## 2018-08-23 ENCOUNTER — Other Ambulatory Visit: Payer: Self-pay | Admitting: Internal Medicine

## 2018-08-23 DIAGNOSIS — N644 Mastodynia: Secondary | ICD-10-CM

## 2018-09-06 ENCOUNTER — Other Ambulatory Visit: Payer: 59

## 2018-09-15 ENCOUNTER — Ambulatory Visit
Admission: RE | Admit: 2018-09-15 | Discharge: 2018-09-15 | Disposition: A | Discharge status: Home / Self Care | Payer: 59 | Source: Ambulatory Visit | Attending: Internal Medicine | Admitting: Internal Medicine

## 2018-09-15 ENCOUNTER — Other Ambulatory Visit: Payer: Self-pay

## 2018-09-15 DIAGNOSIS — N644 Mastodynia: Secondary | ICD-10-CM

## 2018-11-06 NOTE — H&P (Addendum)
HPI:   Gabriela Moses is a 33 y.o. female who presents as a return Patient.   Referring Provider: Jilda Panda, MD  Chief complaint: Sinuses.  HPI: For a 44-monthhistory of a foul smell in her nose. It smells like garbage. Others cannot smell it. She still has a normal sense of smell otherwise. She has occasional postnasal drainage. She has occasional cough. She does suffer with sleep apnea. She has been on antibiotics without much change in her symptoms. She has a hard time taking antibiotics because of ulcerative colitis.  PMH/Meds/All/SocHx/FamHx/ROS:   Past Medical History:  Diagnosis Date  . Allergic rhinitis  . Anxiety  . Arthritis  . Colitis  . Depression  . Hypertension with goal to be determined  . Ovarian cyst rupture  . Spider bite   Past Surgical History:  Procedure Laterality Date  . TOOTH EXTRACTION   No family history of bleeding disorders, wound healing problems or difficulty with anesthesia.   Social History   Socioeconomic History  . Marital status: Married  Spouse name: Not on file  . Number of children: Not on file  . Years of education: Not on file  . Highest education level: Not on file  Occupational History  . Not on file  Social Needs  . Financial resource strain: Not on file  . Food insecurity  Worry: Not on file  Inability: Not on file  . Transportation needs  Medical: Not on file  Non-medical: Not on file  Tobacco Use  . Smoking status: Never Smoker  . Smokeless tobacco: Never Used  Substance and Sexual Activity  . Alcohol use: No  . Drug use: No  . Sexual activity: Not on file  Lifestyle  . Physical activity  Days per week: Not on file  Minutes per session: Not on file  . Stress: Not on file  Relationships  . Social cMedical illustratoron phone: Not on file  Gets together: Not on file  Attends religious service: Not on file  Active member of club or organization: Not on file  Attends meetings of clubs or organizations:  Not on file  Relationship status: Not on file  Other Topics Concern  . Not on file  Social History Narrative  ** Merged History Encounter **    Current Outpatient Medications:  . CAMILA 0.35 mg tablet, TAKE 1 TABLET BY MOUTH EVERY DAY, Disp: , Rfl: 3 . fluticasone propionate (FLONASE) 50 mcg/actuation nasal spray, 2 sprays by Nasal route daily. Instill 2 puffs each nostril every night., Disp: 1 Inhaler, Rfl: 5 . mesalamine (LIALDA) 1.2 gram EC tablet, Take 3 tablets (3.6 g total) by mouth daily with breakfast., Disp: 270 tablet, Rfl: 2 . sertraline (ZOLOFT) 100 MG tablet, Take 100 mg by mouth daily. Take 2 tabs daily, Disp: , Rfl:  . triamterene (DYRENIUM) 50 MG capsule, , Disp: , Rfl:  . dicyclomine (BENTYL) 20 mg tablet, Take 1 tablet (20 mg total) by mouth 4 times daily for 30 days., Disp: 120 tablet, Rfl: 0  A complete ROS was performed with pertinent positives/negatives noted in the HPI. The remainder of the ROS are negative.   Physical Exam:   There were no vitals taken for this visit.  General: Healthy and alert, in no distress, breathing easily. Normal affect. In a pleasant mood. Head: Normocephalic, atraumatic. No masses, or scars. Eyes: Pupils are equal, and reactive to light. Vision is grossly intact. No spontaneous or gaze nystagmus. Ears: Ear canals are clear. Tympanic membranes  are intact, with normal landmarks and the middle ears are clear and healthy. Hearing: Grossly normal. Nose: Nasal cavities are clear with healthy mucosa, no polyps or exudate. Airways are patent. Face: No masses or scars, facial nerve function is symmetric. Oral Cavity: No mucosal abnormalities are noted. Tongue with normal mobility. Dentition appears healthy. Oropharynx: Tonsils are symmetric. There are no mucosal masses identified. Tongue base appears normal and healthy. Larynx/Hypopharynx: deferred Chest: Deferred Neck: No palpable masses, no cervical adenopathy, no thyroid nodules or  enlargement. Neuro: Cranial nerves II-XII with normal function. Balance: Normal gate. Other findings: none.  Independent Review of Additional Tests or Records:  CT scan last visit reveals chronic sinus disease bilateral ethmoids and maxillary sinuses. It is worse on the left. The disease extends into the hypoplastic left frontal sinus. 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  Procedures:  none  Impression & Plans:  Chronic sinusitis. Antibiotics have not helped. Due to her intestinal disease, continued antibiotics are ill advised. Recommend consider endoscopic sinus surgery including bilateral ethmoid and maxillary sinus surgery. Risks and benefits were discussed in detail with her and her mother. She will think about this and will let us know when she is ready to schedule.

## 2018-11-10 ENCOUNTER — Other Ambulatory Visit: Payer: Self-pay

## 2018-11-10 ENCOUNTER — Encounter (HOSPITAL_BASED_OUTPATIENT_CLINIC_OR_DEPARTMENT_OTHER): Payer: Self-pay | Admitting: *Deleted

## 2018-11-15 ENCOUNTER — Encounter (HOSPITAL_BASED_OUTPATIENT_CLINIC_OR_DEPARTMENT_OTHER)
Admission: RE | Admit: 2018-11-15 | Discharge: 2018-11-15 | Disposition: A | Discharge status: Home / Self Care | Payer: 59 | Source: Ambulatory Visit | Attending: Otolaryngology | Admitting: Otolaryngology

## 2018-11-15 ENCOUNTER — Other Ambulatory Visit (HOSPITAL_COMMUNITY)
Admission: RE | Admit: 2018-11-15 | Discharge: 2018-11-15 | Disposition: A | Discharge status: Home / Self Care | Payer: 59 | Source: Ambulatory Visit | Attending: Otolaryngology | Admitting: Otolaryngology

## 2018-11-15 DIAGNOSIS — F329 Major depressive disorder, single episode, unspecified: Secondary | ICD-10-CM | POA: Diagnosis not present

## 2018-11-15 DIAGNOSIS — J324 Chronic pansinusitis: Secondary | ICD-10-CM | POA: Insufficient documentation

## 2018-11-15 DIAGNOSIS — Z20828 Contact with and (suspected) exposure to other viral communicable diseases: Secondary | ICD-10-CM | POA: Diagnosis not present

## 2018-11-15 DIAGNOSIS — J309 Allergic rhinitis, unspecified: Secondary | ICD-10-CM | POA: Diagnosis not present

## 2018-11-15 DIAGNOSIS — I10 Essential (primary) hypertension: Secondary | ICD-10-CM | POA: Diagnosis not present

## 2018-11-15 DIAGNOSIS — K5190 Ulcerative colitis, unspecified, without complications: Secondary | ICD-10-CM | POA: Diagnosis not present

## 2018-11-15 DIAGNOSIS — Z79899 Other long term (current) drug therapy: Secondary | ICD-10-CM | POA: Diagnosis not present

## 2018-11-15 DIAGNOSIS — Z793 Long term (current) use of hormonal contraceptives: Secondary | ICD-10-CM | POA: Diagnosis not present

## 2018-11-15 DIAGNOSIS — Z01812 Encounter for preprocedural laboratory examination: Secondary | ICD-10-CM | POA: Insufficient documentation

## 2018-11-15 DIAGNOSIS — F419 Anxiety disorder, unspecified: Secondary | ICD-10-CM | POA: Diagnosis not present

## 2018-11-15 DIAGNOSIS — G4730 Sleep apnea, unspecified: Secondary | ICD-10-CM | POA: Diagnosis not present

## 2018-11-16 LAB — NOVEL CORONAVIRUS, NAA (HOSP ORDER, SEND-OUT TO REF LAB; TAT 18-24 HRS): SARS-CoV-2, NAA: NOT DETECTED

## 2018-11-18 ENCOUNTER — Ambulatory Visit (HOSPITAL_BASED_OUTPATIENT_CLINIC_OR_DEPARTMENT_OTHER): Payer: 59 | Admitting: Certified Registered Nurse Anesthetist

## 2018-11-18 ENCOUNTER — Encounter (HOSPITAL_BASED_OUTPATIENT_CLINIC_OR_DEPARTMENT_OTHER): Payer: Self-pay | Admitting: *Deleted

## 2018-11-18 ENCOUNTER — Ambulatory Visit (HOSPITAL_BASED_OUTPATIENT_CLINIC_OR_DEPARTMENT_OTHER)
Admission: RE | Admit: 2018-11-18 | Discharge: 2018-11-18 | Disposition: A | Discharge status: Home / Self Care | Payer: 59 | Attending: Otolaryngology | Admitting: Otolaryngology

## 2018-11-18 ENCOUNTER — Encounter (HOSPITAL_BASED_OUTPATIENT_CLINIC_OR_DEPARTMENT_OTHER)
Admission: RE | Disposition: A | Discharge status: Home / Self Care | Payer: Self-pay | Source: Home / Self Care | Attending: Otolaryngology

## 2018-11-18 ENCOUNTER — Other Ambulatory Visit: Payer: Self-pay

## 2018-11-18 DIAGNOSIS — K5190 Ulcerative colitis, unspecified, without complications: Secondary | ICD-10-CM | POA: Insufficient documentation

## 2018-11-18 DIAGNOSIS — F329 Major depressive disorder, single episode, unspecified: Secondary | ICD-10-CM | POA: Insufficient documentation

## 2018-11-18 DIAGNOSIS — I10 Essential (primary) hypertension: Secondary | ICD-10-CM | POA: Insufficient documentation

## 2018-11-18 DIAGNOSIS — J309 Allergic rhinitis, unspecified: Secondary | ICD-10-CM | POA: Insufficient documentation

## 2018-11-18 DIAGNOSIS — F419 Anxiety disorder, unspecified: Secondary | ICD-10-CM | POA: Insufficient documentation

## 2018-11-18 DIAGNOSIS — Z793 Long term (current) use of hormonal contraceptives: Secondary | ICD-10-CM | POA: Insufficient documentation

## 2018-11-18 DIAGNOSIS — J324 Chronic pansinusitis: Secondary | ICD-10-CM | POA: Diagnosis not present

## 2018-11-18 DIAGNOSIS — Z79899 Other long term (current) drug therapy: Secondary | ICD-10-CM | POA: Insufficient documentation

## 2018-11-18 DIAGNOSIS — G4730 Sleep apnea, unspecified: Secondary | ICD-10-CM | POA: Insufficient documentation

## 2018-11-18 HISTORY — PX: ETHMOIDECTOMY: SHX5197

## 2018-11-18 HISTORY — PX: NASAL SINUS SURGERY: SHX719

## 2018-11-18 HISTORY — DX: Ulcerative colitis, unspecified, without complications: K51.90

## 2018-11-18 HISTORY — DX: Essential (primary) hypertension: I10

## 2018-11-18 LAB — POCT PREGNANCY, URINE: Preg Test, Ur: NEGATIVE

## 2018-11-18 SURGERY — ETHMOIDECTOMY
Anesthesia: General | Site: Nose | Laterality: Bilateral

## 2018-11-18 MED ORDER — MIDAZOLAM HCL 2 MG/2ML IJ SOLN
INTRAMUSCULAR | Status: AC
  Filled 2018-11-18: qty 2

## 2018-11-18 MED ORDER — PHENYLEPHRINE 40 MCG/ML (10ML) SYRINGE FOR IV PUSH (FOR BLOOD PRESSURE SUPPORT)
PREFILLED_SYRINGE | INTRAVENOUS | Status: AC
  Filled 2018-11-18: qty 10

## 2018-11-18 MED ORDER — ACETAMINOPHEN 500 MG PO TABS
ORAL_TABLET | ORAL | Status: AC
  Filled 2018-11-18: qty 2

## 2018-11-18 MED ORDER — PROMETHAZINE HCL 25 MG/ML IJ SOLN: 6.2500 mg | INTRAMUSCULAR | Status: DC | PRN

## 2018-11-18 MED ORDER — METOPROLOL TARTRATE 5 MG/5ML IV SOLN
INTRAVENOUS | Status: AC
  Filled 2018-11-18: qty 5

## 2018-11-18 MED ORDER — ESMOLOL HCL 100 MG/10ML IV SOLN
INTRAVENOUS | Status: DC | PRN
  Administered 2018-11-18 (×4): 25 mg via INTRAVENOUS

## 2018-11-18 MED ORDER — ARTIFICIAL TEARS OPHTHALMIC OINT
TOPICAL_OINTMENT | OPHTHALMIC | Status: AC
  Filled 2018-11-18: qty 3.5

## 2018-11-18 MED ORDER — LIDOCAINE HCL (CARDIAC) PF 100 MG/5ML IV SOSY
PREFILLED_SYRINGE | INTRAVENOUS | Status: DC | PRN
  Administered 2018-11-18: 60 mg via INTRATRACHEAL

## 2018-11-18 MED ORDER — OXYMETAZOLINE HCL 0.05 % NA SOLN
NASAL | Status: DC | PRN
  Administered 2018-11-18: 1 via TOPICAL

## 2018-11-18 MED ORDER — PROPOFOL 10 MG/ML IV BOLUS
INTRAVENOUS | Status: AC
  Filled 2018-11-18: qty 20

## 2018-11-18 MED ORDER — BACITRACIN ZINC 500 UNIT/GM EX OINT
TOPICAL_OINTMENT | CUTANEOUS | Status: AC
  Filled 2018-11-18: qty 28.35

## 2018-11-18 MED ORDER — FENTANYL CITRATE (PF) 100 MCG/2ML IJ SOLN
INTRAMUSCULAR | Status: AC
  Filled 2018-11-18: qty 2

## 2018-11-18 MED ORDER — ROCURONIUM BROMIDE 100 MG/10ML IV SOLN
INTRAVENOUS | Status: DC | PRN
  Administered 2018-11-18: 40 mg via INTRAVENOUS

## 2018-11-18 MED ORDER — ESMOLOL HCL 100 MG/10ML IV SOLN
INTRAVENOUS | Status: AC
  Filled 2018-11-18: qty 10

## 2018-11-18 MED ORDER — ONDANSETRON HCL 4 MG/2ML IJ SOLN
INTRAMUSCULAR | Status: AC
  Filled 2018-11-18: qty 2

## 2018-11-18 MED ORDER — SUGAMMADEX SODIUM 200 MG/2ML IV SOLN
INTRAVENOUS | Status: DC | PRN
  Administered 2018-11-18: 300 mg via INTRAVENOUS

## 2018-11-18 MED ORDER — METOPROLOL TARTRATE 5 MG/5ML IV SOLN
INTRAVENOUS | Status: DC | PRN
  Administered 2018-11-18: 1.5 mg via INTRAVENOUS
  Administered 2018-11-18: 1 mg via INTRAVENOUS

## 2018-11-18 MED ORDER — LABETALOL HCL 5 MG/ML IV SOLN
INTRAVENOUS | Status: AC
  Filled 2018-11-18: qty 4

## 2018-11-18 MED ORDER — PROMETHAZINE HCL 25 MG RE SUPP: 25.0000 mg | Freq: Four times a day (QID) | RECTAL | 1 refills | Status: DC | PRN

## 2018-11-18 MED ORDER — ACETAMINOPHEN 500 MG PO TABS
1000.0000 mg | ORAL_TABLET | Freq: Once | ORAL | Status: AC
  Administered 2018-11-18: 1000 mg via ORAL

## 2018-11-18 MED ORDER — LIDOCAINE-EPINEPHRINE 1 %-1:100000 IJ SOLN
INTRAMUSCULAR | Status: AC
  Filled 2018-11-18: qty 1

## 2018-11-18 MED ORDER — OXYMETAZOLINE HCL 0.05 % NA SOLN
NASAL | Status: AC
  Filled 2018-11-18: qty 30

## 2018-11-18 MED ORDER — ARTIFICIAL TEARS OPHTHALMIC OINT
TOPICAL_OINTMENT | OPHTHALMIC | Status: DC | PRN
  Administered 2018-11-18: 1 via OPHTHALMIC

## 2018-11-18 MED ORDER — PHENYLEPHRINE HCL (PRESSORS) 10 MG/ML IV SOLN
INTRAVENOUS | Status: DC | PRN
  Administered 2018-11-18: 80 ug via INTRAVENOUS
  Administered 2018-11-18: 120 ug via INTRAVENOUS

## 2018-11-18 MED ORDER — FENTANYL CITRATE (PF) 100 MCG/2ML IJ SOLN: 50.0000 ug | INTRAMUSCULAR | Status: DC | PRN

## 2018-11-18 MED ORDER — MIDAZOLAM HCL 2 MG/2ML IJ SOLN
INTRAMUSCULAR | Status: DC | PRN
  Administered 2018-11-18: 2 mg via INTRAVENOUS

## 2018-11-18 MED ORDER — LIDOCAINE 2% (20 MG/ML) 5 ML SYRINGE
INTRAMUSCULAR | Status: AC
  Filled 2018-11-18: qty 5

## 2018-11-18 MED ORDER — LABETALOL HCL 5 MG/ML IV SOLN
5.0000 mg | Freq: Once | INTRAVENOUS | Status: AC
  Administered 2018-11-18: 5 mg via INTRAVENOUS

## 2018-11-18 MED ORDER — FENTANYL CITRATE (PF) 100 MCG/2ML IJ SOLN
INTRAMUSCULAR | Status: DC | PRN
  Administered 2018-11-18 (×2): 50 ug via INTRAVENOUS
  Administered 2018-11-18: 100 ug via INTRAVENOUS

## 2018-11-18 MED ORDER — HYDROCODONE-ACETAMINOPHEN 7.5-325 MG PO TABS: 1.0000 | ORAL_TABLET | Freq: Four times a day (QID) | ORAL | 0 refills | Status: DC | PRN

## 2018-11-18 MED ORDER — LIDOCAINE-EPINEPHRINE 1 %-1:100000 IJ SOLN
INTRAMUSCULAR | Status: DC | PRN
  Administered 2018-11-18: 14 mL

## 2018-11-18 MED ORDER — GLYCOPYRROLATE 0.2 MG/ML IJ SOLN
INTRAMUSCULAR | Status: DC | PRN
  Administered 2018-11-18: 0.2 mg via INTRAVENOUS

## 2018-11-18 MED ORDER — MIDAZOLAM HCL 2 MG/2ML IJ SOLN: 1.0000 mg | INTRAMUSCULAR | Status: DC | PRN

## 2018-11-18 MED ORDER — BACITRACIN 500 UNIT/GM EX OINT: TOPICAL_OINTMENT | CUTANEOUS | Status: DC | PRN

## 2018-11-18 MED ORDER — PROPOFOL 10 MG/ML IV BOLUS
INTRAVENOUS | Status: DC | PRN
  Administered 2018-11-18: 150 mg via INTRAVENOUS

## 2018-11-18 MED ORDER — DEXAMETHASONE SODIUM PHOSPHATE 10 MG/ML IJ SOLN
INTRAMUSCULAR | Status: AC
  Filled 2018-11-18: qty 1

## 2018-11-18 MED ORDER — DEXAMETHASONE SODIUM PHOSPHATE 10 MG/ML IJ SOLN
INTRAMUSCULAR | Status: DC | PRN
  Administered 2018-11-18: 10 mg via INTRAVENOUS

## 2018-11-18 MED ORDER — ONDANSETRON HCL 4 MG/2ML IJ SOLN
INTRAMUSCULAR | Status: DC | PRN
  Administered 2018-11-18: 4 mg via INTRAVENOUS

## 2018-11-18 MED ORDER — ROCURONIUM BROMIDE 10 MG/ML (PF) SYRINGE
PREFILLED_SYRINGE | INTRAVENOUS | Status: AC
  Filled 2018-11-18: qty 10

## 2018-11-18 MED ORDER — LACTATED RINGERS IV SOLN
INTRAVENOUS | Status: DC
  Administered 2018-11-18 (×2): via INTRAVENOUS

## 2018-11-18 MED ORDER — FENTANYL CITRATE (PF) 100 MCG/2ML IJ SOLN
25.0000 ug | INTRAMUSCULAR | Status: DC | PRN
  Administered 2018-11-18: 50 ug via INTRAVENOUS
  Administered 2018-11-18 (×2): 25 ug via INTRAVENOUS

## 2018-11-18 MED ORDER — GLYCOPYRROLATE PF 0.2 MG/ML IJ SOSY
PREFILLED_SYRINGE | INTRAMUSCULAR | Status: AC
  Filled 2018-11-18: qty 1

## 2018-11-18 SURGICAL SUPPLY — 48 items
ATTRACTOMAT 16X20 MAGNETIC DRP (DRAPES) IMPLANT
BLADE RAD40 ROTATE 4M 4 5PK (BLADE) IMPLANT
BLADE RAD40 ROTATE 4M 4MM 5PK (BLADE)
BLADE RAD60 ROTATE M4 4 5PK (BLADE) IMPLANT
BLADE RAD60 ROTATE M4 4MM 5PK (BLADE)
BLADE TRICUT ROTATE M4 4 5PK (BLADE) ×1 IMPLANT
BLADE TRICUT ROTATE M4 4MM 5PK (BLADE) ×1
BUR HS RAD FRONTAL 3 (BURR) IMPLANT
BUR HS RAD FRONTAL 3MM (BURR)
CANISTER SUC SOCK COL 7IN (MISCELLANEOUS) ×3 IMPLANT
CANISTER SUCT 1200ML W/VALVE (MISCELLANEOUS) ×6 IMPLANT
CORD BIPOLAR FORCEPS 12FT (ELECTRODE) IMPLANT
COVER WAND RF STERILE (DRAPES) IMPLANT
DECANTER SPIKE VIAL GLASS SM (MISCELLANEOUS) ×2 IMPLANT
DRESSING NASAL KENNEDY 3.5X.9 (MISCELLANEOUS) IMPLANT
DRSG CURAD 3X16 NADH (PACKING) IMPLANT
DRSG NASAL KENNEDY 3.5X.9 (MISCELLANEOUS)
DRSG NASAL KENNEDY LMNT 8CM (GAUZE/BANDAGES/DRESSINGS) IMPLANT
DRSG NASOPORE 8CM (GAUZE/BANDAGES/DRESSINGS) ×3 IMPLANT
DRSG TELFA 3X8 NADH (GAUZE/BANDAGES/DRESSINGS) IMPLANT
GAUZE 4X4 16PLY RFD (DISPOSABLE) IMPLANT
GAUZE VASELINE FOILPK 1/2 X 72 (GAUZE/BANDAGES/DRESSINGS) IMPLANT
GLOVE ECLIPSE 7.5 STRL STRAW (GLOVE) ×3 IMPLANT
GOWN STRL REUS W/ TWL LRG LVL3 (GOWN DISPOSABLE) ×1 IMPLANT
GOWN STRL REUS W/ TWL XL LVL3 (GOWN DISPOSABLE) ×1 IMPLANT
GOWN STRL REUS W/TWL LRG LVL3 (GOWN DISPOSABLE) ×3
GOWN STRL REUS W/TWL XL LVL3 (GOWN DISPOSABLE) ×3
HEMOSTAT SURGICEL .5X2 ABSORB (HEMOSTASIS) IMPLANT
IV NS 500ML (IV SOLUTION) ×3
IV NS 500ML BAXH (IV SOLUTION) IMPLANT
NDL PRECISIONGLIDE 27X1.5 (NEEDLE) ×1 IMPLANT
NDL SPNL 25GX3.5 QUINCKE BL (NEEDLE) IMPLANT
NEEDLE PRECISIONGLIDE 27X1.5 (NEEDLE) ×3 IMPLANT
NEEDLE SPNL 25GX3.5 QUINCKE BL (NEEDLE) IMPLANT
NS IRRIG 1000ML POUR BTL (IV SOLUTION) ×3 IMPLANT
PACK BASIN DAY SURGERY FS (CUSTOM PROCEDURE TRAY) ×3 IMPLANT
PACK ENT DAY SURGERY (CUSTOM PROCEDURE TRAY) ×3 IMPLANT
PAD DRESSING TELFA 3X8 NADH (GAUZE/BANDAGES/DRESSINGS) IMPLANT
PATTIES SURGICAL .5 X3 (DISPOSABLE) ×3 IMPLANT
SLEEVE SCD COMPRESS KNEE MED (MISCELLANEOUS) ×3 IMPLANT
SOLUTION BUTLER CLEAR DIP (MISCELLANEOUS) ×3 IMPLANT
SPONGE GAUZE 2X2 8PLY STER LF (GAUZE/BANDAGES/DRESSINGS) ×1
SPONGE GAUZE 2X2 8PLY STRL LF (GAUZE/BANDAGES/DRESSINGS) ×2 IMPLANT
SPONGE SURGIFOAM ABS GEL 12-7 (HEMOSTASIS) IMPLANT
TOWEL GREEN STERILE FF (TOWEL DISPOSABLE) ×3 IMPLANT
TUBE CONNECTING 20'X1/4 (TUBING) ×1
TUBE CONNECTING 20X1/4 (TUBING) ×1 IMPLANT
YANKAUER SUCT BULB TIP NO VENT (SUCTIONS) ×3 IMPLANT

## 2018-11-18 NOTE — Interval H&P Note (Signed)
History and Physical Interval Note:  11/18/2018 7:19 AM  Gabriela Moses  has presented today for surgery, with the diagnosis of J32.4 Chronic pansinusitis.  The various methods of treatment have been discussed with the patient and family. After consideration of risks, benefits and other options for treatment, the patient has consented to  Procedure(s): Bilateral Endoscopic Ethmoid (Bilateral) Maxillary sinus surgery (Bilateral) as a surgical intervention.  The patient's history has been reviewed, patient examined, no change in status, stable for surgery.  I have reviewed the patient's chart and labs.  Questions were answered to the patient's satisfaction.     [name]

## 2018-11-18 NOTE — Op Note (Signed)
OPERATIVE REPORT  DATE OF SURGERY: 11/18/2018  PATIENT:  Gabriela Moses,  33 y.o. 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 Following induction of general endotracheal anesthesia, patient was prepped and draped in standard fashion.  0 and 30 degrees nasal endoscopes were used throughout the case.  Afrin was applied to the nasal cavities on pledgets on both sides.  1% Xylocaine with epinephrine was infiltrated into the superior and posterior attachments of the middle turbinates and lateral nasal wall.  Bilateral endoscopic ethmoidectomy.  A sickle knife was used to remove the uncinate process at its base on both sides.  Left side was performed first followed by the right.  Following that the 0 degree endoscope was used with the microdebrider to remove the bulla and opened up into the ethmoid complex.  A complete ethmoid dissection was accomplished on both sides.  There is extensive polypoid disease present worse on the left but present on both sides.  There is some thick inspissated mucus also present within multiple cells on the left side.  The grand lamella was taken down to expose the posterior cells.  The fovea ethmoidalis was the superior limit of dissection was kept intact.  The lamina papyracea was the lateral limit and was also kept intact.  On the right side the frontal recess was underdeveloped.  On the left the frontal recess contained significant polypoid disease which was all cleaned out using  giraffe forceps.  This opened up the recess nicely and provided access to the frontal sinus.  At the termination of the procedure half of a nasal pore dressing was placed into each of the ethmoid cavities.  Middle turbinates were kept intact.  Bilateral endoscopic maxillary antrostomy.  On both sides the 30 degree scope and curved suction was used to enter into the maxillary sinus through the natural ostium.  The natural ostium was enlarged anteriorly using backbiting forceps and posteriorly using the Tru-Cut forceps and the microdebrider.  There was no abnormal tissue present within the maxillary sinuses on either side.    Nasal cavities were suctioned of blood and secretions.  Pharynx was suctioned under direct visualization.  Patient was awakened extubated and transferred to recovery in stable condition.    PATIENT DISPOSITION:  To PACU, stable

## 2018-11-18 NOTE — Anesthesia Preprocedure Evaluation (Signed)
Anesthesia Evaluation  Patient identified by MRN, date of birth, ID band Patient awake    Reviewed: Allergy & Precautions, NPO status , Patient's Chart, lab work & pertinent test results  Airway Mallampati: II  TM Distance: >3 FB     Dental  (+) Dental Advisory Given   Pulmonary sleep apnea ,    breath sounds clear to auscultation       Cardiovascular hypertension, Pt. on medications  Rhythm:Regular Rate:Normal     Neuro/Psych negative neurological ROS     GI/Hepatic Neg liver ROS, PUD (UC),   Endo/Other  negative endocrine ROS  Renal/GU negative Renal ROS     Musculoskeletal  (+) Arthritis ,   Abdominal   Peds  Hematology negative hematology ROS (+)   Anesthesia Other Findings   Reproductive/Obstetrics                             Anesthesia Physical Anesthesia Plan  ASA: II  Anesthesia Plan: General   Post-op Pain Management:    Induction: Intravenous  PONV Risk Score and Plan: 3 and Dexamethasone, Ondansetron and Treatment may vary due to age or medical condition  Airway Management Planned: Oral ETT  Additional Equipment:   Intra-op Plan:   Post-operative Plan: Extubation in OR  Informed Consent: I have reviewed the patients History and Physical, chart, labs and discussed the procedure including the risks, benefits and alternatives for the proposed anesthesia with the patient or authorized representative who has indicated his/her understanding and acceptance.     Dental advisory given  Plan Discussed with: CRNA  Anesthesia Plan Comments:         Anesthesia Quick Evaluation

## 2018-11-18 NOTE — Anesthesia Postprocedure Evaluation (Signed)
Anesthesia Post Note  Patient: Gabriela Moses  Procedure(s) Performed: Bilateral Endoscopic Ethmoid (Bilateral Nose) Maxillary sinus surgery (Bilateral Nose)     Patient location during evaluation: PACU Anesthesia Type: General Level of consciousness: awake and alert Pain management: pain level controlled Vital Signs Assessment: post-procedure vital signs reviewed and stable Respiratory status: spontaneous breathing, nonlabored ventilation, respiratory function stable and patient connected to nasal cannula oxygen Cardiovascular status: blood pressure returned to baseline and stable Postop Assessment: no apparent nausea or vomiting Anesthetic complications: no    Last Vitals:  Vitals:   11/18/18 1000 11/18/18 1114  BP: (!) 149/101 (!) 147/98  Pulse: (!) 103 100  Resp: 13 20  Temp:  37.2 C  SpO2: 96% 95%    Last Pain:  Vitals:   11/18/18 1114  TempSrc: Oral  PainSc: 2                  Sherrina Zaugg L Jaydi Bray

## 2018-11-18 NOTE — Transfer of Care (Signed)
Immediate Anesthesia Transfer of Care Note  Patient: Taraneh Metheney  Procedure(s) Performed: Bilateral Endoscopic Ethmoid (Bilateral Nose) Maxillary sinus surgery (Bilateral Nose)  Patient Location: PACU  Anesthesia Type:General  Level of Consciousness: awake, alert  and patient cooperative  Airway & Oxygen Therapy: Patient connected to face mask oxygen  Post-op Assessment: Report given to RN, Post -op Vital signs reviewed and stable, Patient moving all extremities X 4 and Patient able to stick tongue midline  Post vital signs: Reviewed and stable  Last Vitals:  Vitals Value Taken Time  BP 159/96 11/18/18 0900  Temp    Pulse 91 11/18/18 0901  Resp 8 11/18/18 0901  SpO2 100 % 11/18/18 0901  Vitals shown include unvalidated device data.  Last Pain:  Vitals:   11/18/18 0636  TempSrc: Oral  PainSc: 1       Patients Stated Pain Goal: 1 (46/52/07 6191)  Complications: No apparent anesthesia complications

## 2018-11-18 NOTE — Discharge Instructions (Signed)
Start using nasal saline spray every 30 minutes - 1 hour while awake.  Call if there is excessive bleeding.  It is okay to use Tylenol as needed for pain or use the prescription medication provided.    For the next 3 weeks:  Do not lifting anything greater than 10 pounds. Do not blow your nose. Minimize bending over.   It is okay to get up and walk around, eat and drink what she would like, go outside.    Post Anesthesia Home Care Instructions  Activity: Get plenty of rest for the remainder of the day. A responsible individual must stay with you for 24 hours following the procedure.  For the next 24 hours, DO NOT: -Drive a car -Paediatric nurse -Drink alcoholic beverages -Take any medication unless instructed by your physician -Make any legal decisions or sign important papers.  Meals: Start with liquid foods such as gelatin or soup. Progress to regular foods as tolerated. Avoid greasy, spicy, heavy foods. If nausea and/or vomiting occur, drink only clear liquids until the nausea and/or vomiting subsides. Call your physician if vomiting continues.  Special Instructions/Symptoms: Your throat may feel dry or sore from the anesthesia or the breathing tube placed in your throat during surgery. If this causes discomfort, gargle with warm salt water. The discomfort should disappear within 24 hours.  If you had a scopolamine patch placed behind your ear for the management of post- operative nausea and/or vomiting:  1. The medication in the patch is effective for 72 hours, after which it should be removed.  Wrap patch in a tissue and discard in the trash. Wash hands thoroughly with soap and water. 2. You may remove the patch earlier than 72 hours if you experience unpleasant side effects which may include dry mouth, dizziness or visual disturbances. 3. Avoid touching the patch. Wash your hands with soap and water after contact with the patch.   No tylenol until after 1pm today

## 2018-11-18 NOTE — Anesthesia Procedure Notes (Signed)
Procedure Name: Intubation Date/Time: 11/18/2018 7:40 AM Performed by: Suzette Battiest, MD Pre-anesthesia Checklist: Patient identified, Emergency Drugs available, Suction available and Patient being monitored Patient Re-evaluated:Patient Re-evaluated prior to induction Oxygen Delivery Method: Circle system utilized Preoxygenation: Pre-oxygenation with 100% oxygen Induction Type: IV induction Ventilation: Oral airway inserted - appropriate to patient size Laryngoscope Size: Mac and 3 Grade View: Grade I Tube type: Oral Tube size: 7.0 mm Number of attempts: 1 Airway Equipment and Method: Stylet Placement Confirmation: ETT inserted through vocal cords under direct vision,  positive ETCO2 and breath sounds checked- equal and bilateral Secured at: 22 cm Tube secured with: Tape Dental Injury: Teeth and Oropharynx as per pre-operative assessment

## 2018-11-19 ENCOUNTER — Encounter (HOSPITAL_BASED_OUTPATIENT_CLINIC_OR_DEPARTMENT_OTHER): Payer: Self-pay | Admitting: Otolaryngology

## 2018-12-01 LAB — SURGICAL PATHOLOGY

## 2019-09-30 ENCOUNTER — Other Ambulatory Visit: Payer: Self-pay

## 2019-09-30 ENCOUNTER — Ambulatory Visit
Admission: RE | Admit: 2019-09-30 | Discharge: 2019-09-30 | Disposition: A | Discharge status: Home / Self Care | Payer: 59 | Source: Ambulatory Visit | Attending: Internal Medicine | Admitting: Internal Medicine

## 2019-09-30 ENCOUNTER — Other Ambulatory Visit: Payer: Self-pay | Admitting: Internal Medicine

## 2019-09-30 DIAGNOSIS — R05 Cough: Secondary | ICD-10-CM

## 2019-09-30 DIAGNOSIS — R059 Cough, unspecified: Secondary | ICD-10-CM

## 2019-10-05 ENCOUNTER — Other Ambulatory Visit: Payer: Self-pay | Admitting: Physician Assistant

## 2019-10-05 DIAGNOSIS — R590 Localized enlarged lymph nodes: Secondary | ICD-10-CM

## 2019-10-07 ENCOUNTER — Ambulatory Visit
Admission: RE | Admit: 2019-10-07 | Discharge: 2019-10-07 | Disposition: A | Discharge status: Home / Self Care | Payer: 59 | Source: Ambulatory Visit | Attending: Physician Assistant | Admitting: Physician Assistant

## 2019-10-07 DIAGNOSIS — R590 Localized enlarged lymph nodes: Secondary | ICD-10-CM

## 2019-10-11 ENCOUNTER — Encounter (HOSPITAL_BASED_OUTPATIENT_CLINIC_OR_DEPARTMENT_OTHER): Payer: Self-pay | Admitting: *Deleted

## 2019-10-11 ENCOUNTER — Other Ambulatory Visit: Payer: Self-pay

## 2019-10-11 ENCOUNTER — Emergency Department (HOSPITAL_BASED_OUTPATIENT_CLINIC_OR_DEPARTMENT_OTHER)
Admission: EM | Admit: 2019-10-11 | Discharge: 2019-10-11 | Disposition: A | Discharge status: Home / Self Care | Payer: 59 | Attending: Emergency Medicine | Admitting: Emergency Medicine

## 2019-10-11 DIAGNOSIS — I10 Essential (primary) hypertension: Secondary | ICD-10-CM | POA: Insufficient documentation

## 2019-10-11 DIAGNOSIS — Z79899 Other long term (current) drug therapy: Secondary | ICD-10-CM | POA: Diagnosis not present

## 2019-10-11 DIAGNOSIS — R519 Headache, unspecified: Secondary | ICD-10-CM | POA: Diagnosis not present

## 2019-10-11 DIAGNOSIS — R809 Proteinuria, unspecified: Secondary | ICD-10-CM | POA: Insufficient documentation

## 2019-10-11 DIAGNOSIS — R110 Nausea: Secondary | ICD-10-CM | POA: Insufficient documentation

## 2019-10-11 DIAGNOSIS — R3915 Urgency of urination: Secondary | ICD-10-CM | POA: Insufficient documentation

## 2019-10-11 DIAGNOSIS — R631 Polydipsia: Secondary | ICD-10-CM | POA: Diagnosis not present

## 2019-10-11 DIAGNOSIS — H539 Unspecified visual disturbance: Secondary | ICD-10-CM | POA: Diagnosis not present

## 2019-10-11 DIAGNOSIS — M545 Low back pain, unspecified: Secondary | ICD-10-CM

## 2019-10-11 LAB — URINALYSIS, ROUTINE W REFLEX MICROSCOPIC
Glucose, UA: NEGATIVE mg/dL
Hgb urine dipstick: NEGATIVE
Ketones, ur: 15 mg/dL — AB
Leukocytes,Ua: NEGATIVE
Nitrite: NEGATIVE
Protein, ur: 30 mg/dL — AB
Specific Gravity, Urine: >1.03 — ABNORMAL HIGH (ref 1.005–1.030)
pH: 6 (ref 5.0–8.0)

## 2019-10-11 LAB — URINALYSIS, MICROSCOPIC (REFLEX)

## 2019-10-11 LAB — PREGNANCY, URINE: Preg Test, Ur: NEGATIVE

## 2019-10-11 NOTE — ED Triage Notes (Signed)
Back pain and double vision. Insomnia. Nausea in the am's. Symptoms for weeks. She was seen many times but no diagnosis.

## 2019-10-11 NOTE — ED Provider Notes (Signed)
[facility] EMERGENCY DEPARTMENT Provider Note   CSN: [id number] Arrival date & time: 10/11/19  1321     History Chief Complaint  Patient presents with  . Back Pain  . Eye Problem    Gabriela Moses is a 34 y.o. female.  34 year old female with complaint of low back pain for several weeks, intermittent, nothing makes pain worse, burning/aching in nature. With burning in the hands and feet with "nerve jumps in the muscles." Reports urinary urgency without dysuria, reports polydipsia, no history of diabetes. No known medications that cause increase in thirst, history of interstitial cystitis. Also, right eye pain Friday, intermittent since that time. Onset while lying down, awake, sudden onset lasting for a minute, then resolved. Has occasional inferior orbit pain, occasional affects the right eye. No pain with movement of they eyes or with palpation of the area, not watering, no photophobia, no eye redness. No injuries. Wears glasses. No history of similar pain previously. Reports pain as "very barely" at this time. Nothing makes pain worse or better.  As well as chronic complaints of not sleeping well, loss of appetite, double vision at times in both eyes.  Patient was seen by her eye doctor for her vision complaints and has been told there is nothing found or no known cause for this.        Past Medical History:  Diagnosis Date  . Anxiety   . Arthritis   . Chronic cough   . Depression   . Hypertension   . Irritable bowel syndrome (IBS)   . OCD (obsessive compulsive disorder)   . PVC (premature ventricular contraction)   . UC (ulcerative colitis) ([facility])   . UTI (urinary tract infection) during pregnancy     Patient Active Problem List   Diagnosis Date Noted  . Dysuria 06/03/2017  . Chronic diarrhea 06/03/2017  . Chronic abdominal pain 06/03/2017  . Obstructive sleep apnea 06/03/2017  . Seasonal allergies 06/03/2017  . Anxiety 06/03/2017  . Depression 06/03/2017   . Interstitial cystitis 06/03/2017    Past Surgical History:  Procedure Laterality Date  . ETHMOIDECTOMY Bilateral 11/18/2018   Procedure: Bilateral Endoscopic Ethmoid;  Surgeon: [name], MD;  Location: [facility];  Service: ENT;  Laterality: Bilateral;  . NASAL SINUS SURGERY Bilateral 11/18/2018   Procedure: Maxillary sinus surgery;  Surgeon: [name], MD;  Location: [facility];  Service: ENT;  Laterality: Bilateral;  . WISDOM TOOTH EXTRACTION       OB History    Gravida  2   Para  1   Term  1   Preterm      AB  1   Living  1     SAB  1   TAB      Ectopic      Multiple      Live Births  1           Family History  Problem Relation Age of Onset  . Hypertension Mother   . Hyperlipidemia Mother   . Arthritis Maternal Grandmother   . Hyperlipidemia Maternal Grandmother   . Hypertension Maternal Grandmother   . Breast cancer Neg Hx     Social History   Tobacco Use  . Smoking status: Never Smoker  . Smokeless tobacco: Never Used  Vaping Use  . Vaping Use: Never used  Substance Use Topics  . Alcohol use: Yes    Comment: occ  . Drug use: No    Home  Medications Prior to Admission medications   Medication Sig Start Date End Date Taking? Authorizing Provider  albuterol (PROVENTIL HFA;VENTOLIN HFA) 108 (90 Base) MCG/ACT inhaler Inhale 2 puffs into the lungs every 6 (six) hours as needed for shortness of breath. 05/07/18  Yes Withrow, Elyse Jarvis, FNP  amLODipine (NORVASC) 5 MG tablet Take 5 mg by mouth daily. 10/07/19  Yes [provider]  mesalamine (LIALDA) 1.2 g EC tablet Take 4.8 g by mouth daily with breakfast.   Yes [provider]  norethindrone (CAMILA) 0.35 MG tablet Take 1 tablet by mouth daily.   Yes [provider]  promethazine (PHENERGAN) 25 MG suppository Place 1 suppository (25 mg total) rectally every 6 (six) hours as needed for nausea or vomiting. 11/18/18  Yes [name], MD    sertraline (ZOLOFT) 100 MG tablet Take 200 mg by mouth daily.   Yes [provider]  benzonatate (TESSALON) 200 MG capsule Take 1 capsule (200 mg total) by mouth every 8 (eight) hours as needed for cough. 05/07/18   Withrow, Elyse Jarvis, FNP  HYDROcodone-acetaminophen (NORCO) 7.5-325 MG tablet Take 1 tablet by mouth every 6 (six) hours as needed for moderate pain. 11/18/18   [name], MD  triamterene (DYRENIUM) 50 MG capsule Take 50 mg by mouth daily.    [provider]    Allergies    Azithromycin, Benadryl [diphenhydramine hcl], Sulfa antibiotics, and Terconazole  Review of Systems   Review of Systems  Constitutional: Negative for fever.  Eyes: Positive for visual disturbance. Negative for photophobia, pain, discharge, redness and itching.  Respiratory: Negative for shortness of breath.   Cardiovascular: Negative for chest pain.  Gastrointestinal: Positive for nausea. Negative for abdominal pain.  Endocrine: Positive for polydipsia.  Genitourinary: Positive for urgency. Negative for dysuria and frequency.  Musculoskeletal: Positive for back pain.  Allergic/Immunologic: Negative for immunocompromised state.  Neurological: Negative for weakness and numbness.  All other systems reviewed and are negative.   Physical Exam Updated Vital Signs BP (!) 134/91   Pulse 92   Temp 98.9 F (37.2 C) (Oral)   Resp 16   Ht 5' 7"  (1.702 m)   Wt 70.9 kg   LMP 09/10/2019   SpO2 96%   BMI 24.50 kg/m   Physical Exam Vitals and nursing note reviewed.  Constitutional:      General: She is not in acute distress.    Appearance: She is well-developed. She is not diaphoretic.  HENT:     Head: Normocephalic and atraumatic.     Right Ear: Tympanic membrane and ear canal normal.     Left Ear: Tympanic membrane and ear canal normal.     Nose: Nose normal.     Mouth/Throat:     Mouth: Mucous membranes are moist.     Pharynx: Oropharynx is clear. No oropharyngeal exudate or  posterior oropharyngeal erythema.  Eyes:     Extraocular Movements: Extraocular movements intact.     Conjunctiva/sclera: Conjunctivae normal.     Pupils: Pupils are equal, round, and reactive to light.  Cardiovascular:     Rate and Rhythm: Normal rate and regular rhythm.     Pulses: Normal pulses.     Heart sounds: Normal heart sounds.  Pulmonary:     Effort: Pulmonary effort is normal.     Breath sounds: Normal breath sounds.  Abdominal:     Palpations: Abdomen is soft.     Tenderness: There is no abdominal tenderness. There is no right CVA tenderness or  left CVA tenderness.  Musculoskeletal:        General: No swelling, tenderness or deformity.     Cervical back: Normal range of motion and neck supple. No tenderness.     Right lower leg: No edema.     Left lower leg: No edema.  Skin:    General: Skin is warm and dry.     Findings: No erythema or rash.  Neurological:     General: No focal deficit present.     Mental Status: She is alert and oriented to person, place, and time.     Cranial Nerves: No cranial nerve deficit.     Sensory: No sensory deficit.     Motor: No weakness.     Gait: Gait normal.  Psychiatric:        Behavior: Behavior normal.     ED Results / Procedures / Treatments   Labs (all labs ordered are listed, but only abnormal results are displayed) Labs Reviewed  URINALYSIS, ROUTINE W REFLEX MICROSCOPIC - Abnormal; Notable for the following components:      Result Value   Specific Gravity, Urine >1.030 (*)    Bilirubin Urine SMALL (*)    Ketones, ur 15 (*)    Protein, ur 30 (*)    All other components within normal limits  URINALYSIS, MICROSCOPIC (REFLEX) - Abnormal; Notable for the following components:   Bacteria, UA RARE (*)    All other components within normal limits  PREGNANCY, URINE    EKG None  Radiology No results found.  Procedures Procedures (including critical care time)  Medications Ordered in ED Medications - No data to  display  ED Course  I have reviewed the triage vital signs and the nursing notes.  Pertinent labs & imaging results that were available during my care of the patient were reviewed by me and considered in my medical decision making (see chart for details).  Clinical Course as of Oct 11 1727  Tue Aug 24, 574  [id number] 34 year old female with presentation as above. Exam is unremarkable. UA with specific gravity elevated with small bili, ketones, protein, negative for UTI, pregnancy test negative. Recommend recheck with PCP, return to ER for worsening or concerning symptoms. Case discussed with Dr. Regenia Skeeter, agrees with plan of care.    [LM]    Clinical Course User Index [LM] Roque Lias   MDM Rules/Calculators/A&P                          Final Clinical Impression(s) / ED Diagnoses Final diagnoses:  Acute midline low back pain without sciatica  Facial pain  Proteinuria, unspecified type    Rx / DC Orders ED Discharge Orders    None       Tacy Learn, PA-C 10/11/19 1729    Sherwood Gambler, MD 10/14/19 [phone]

## 2019-10-11 NOTE — Discharge Instructions (Addendum)
Recheck with your primary care provider. Your urine does not show infection. You do have protein in your urine today, this should be followed by your PCP. Eye exam today is unremarkable.

## 2019-11-16 ENCOUNTER — Other Ambulatory Visit: Payer: Self-pay | Admitting: Physician Assistant

## 2019-11-16 DIAGNOSIS — J329 Chronic sinusitis, unspecified: Secondary | ICD-10-CM

## 2019-11-21 ENCOUNTER — Ambulatory Visit
Admission: RE | Admit: 2019-11-21 | Discharge: 2019-11-21 | Disposition: A | Discharge status: Home / Self Care | Payer: 59 | Source: Ambulatory Visit | Attending: Physician Assistant | Admitting: Physician Assistant

## 2019-11-21 DIAGNOSIS — J329 Chronic sinusitis, unspecified: Secondary | ICD-10-CM

## 2019-11-28 ENCOUNTER — Other Ambulatory Visit: Payer: 59

## 2020-01-30 NOTE — Progress Notes (Signed)
Patient referred by Jilda Panda, MD for palpitations  Subjective:   Gabriela Moses, female    DOB: [birth year], 34 y.o.   MRN: [id number]   Chief Complaint  Patient presents with  . PCV  . New Patient (Initial Visit)    HPI  34 y.o. Caucasian female with controlled hypertension, symptomatic PVCs, palpitations.  Patient was previously seen in 2018.  She has had symptomatic PVCs in the past.  Work-up did not show any other significant abnormalities.  For the last few weeks, patient had worsening symptoms of palpitations. She denies chest pain, shortness of breath,, leg edema, orthopnea, PND, TIA/syncope.    Past Medical History:  Diagnosis Date  . Anxiety   . Arthritis   . Chronic cough   . Depression   . Hypertension   . Irritable bowel syndrome (IBS)   . OCD (obsessive compulsive disorder)   . PVC (premature ventricular contraction)   . UC (ulcerative colitis) ([facility])   . UTI (urinary tract infection) during pregnancy      Past Surgical History:  Procedure Laterality Date  . ETHMOIDECTOMY Bilateral 11/18/2018   Procedure: Bilateral Endoscopic Ethmoid;  Surgeon: [name], MD;  Location: [facility];  Service: ENT;  Laterality: Bilateral;  . NASAL SINUS SURGERY Bilateral 11/18/2018   Procedure: Maxillary sinus surgery;  Surgeon: [name], MD;  Location: [facility];  Service: ENT;  Laterality: Bilateral;  . WISDOM TOOTH EXTRACTION       Social History   Tobacco Use  Smoking Status Never Smoker  Smokeless Tobacco Never Used    Social History   Substance and Sexual Activity  Alcohol Use Yes   Comment: occ     Family History  Problem Relation Age of Onset  . Hypertension Mother   . Hyperlipidemia Mother   . Arthritis Maternal Grandmother   . Hyperlipidemia Maternal Grandmother   . Hypertension Maternal Grandmother   . Breast cancer Neg Hx      Current Outpatient Medications on File Prior to Visit  Medication Sig  Dispense Refill  . albuterol (PROVENTIL HFA;VENTOLIN HFA) 108 (90 Base) MCG/ACT inhaler Inhale 2 puffs into the lungs every 6 (six) hours as needed for shortness of breath. 1 Inhaler 0  . amLODipine (NORVASC) 5 MG tablet Take 5 mg by mouth daily.    . benzonatate (TESSALON) 200 MG capsule Take 1 capsule (200 mg total) by mouth every 8 (eight) hours as needed for cough. 30 capsule 0  . HYDROcodone-acetaminophen (NORCO) 7.5-325 MG tablet Take 1 tablet by mouth every 6 (six) hours as needed for moderate pain. 30 tablet 0  . mesalamine (LIALDA) 1.2 g EC tablet Take 4.8 g by mouth daily with breakfast.    . norethindrone (CAMILA) 0.35 MG tablet Take 1 tablet by mouth daily.    . promethazine (PHENERGAN) 25 MG suppository Place 1 suppository (25 mg total) rectally every 6 (six) hours as needed for nausea or vomiting. 12 suppository 1  . sertraline (ZOLOFT) 100 MG tablet Take 200 mg by mouth daily.    Marland Kitchen triamterene (DYRENIUM) 50 MG capsule Take 50 mg by mouth daily.     No current facility-administered medications on file prior to visit.    Cardiovascular and other pertinent studies:  EKG 02/01/2020: Sinus rhythm 85 bpm Nonspecific T wave abnormality   Monitor 2018: Rare PAC, PVC  Treadmill stress test 2016: 7 METS No ischemic changes  Echocardiogram 2016:  LVEF 50-55%. Trace MR, trace TR  Recent labs: 09/01/2019-11/29/2019: Glucose 91, BUN/Cr 12/0.57. EGFR 121. Na/K 139/3.9. Rest of the CMP normal H/H 10.2/35.6. MCV 21.2. Platelets 366 HbA1C 5.8% Chol 181, TG 80, HDL 49, LDL 116 TSH 1.91 normal   Review of Systems  Cardiovascular: Positive for palpitations. Negative for chest pain, dyspnea on exertion, leg swelling and syncope.         Vitals:   02/01/20 1401 02/01/20 1403  BP: (!) 146/103 135/89  Pulse: 100 (!) 102  Resp: 16   SpO2: 97% 98%     Body mass index is 26.63 kg/m. Filed Weights   02/01/20 1401  Weight: 170 lb (77.1 kg)     Objective:    Physical Exam Vitals and nursing note reviewed.  Constitutional:      General: She is not in acute distress. Neck:     Vascular: No JVD.  Cardiovascular:     Rate and Rhythm: Normal rate and regular rhythm.     Heart sounds: Normal heart sounds. No murmur heard.   Pulmonary:     Effort: Pulmonary effort is normal.     Breath sounds: Normal breath sounds. No wheezing or rales.        Assessment & Recommendations:   34 y.o. Caucasian female with controlled hypertension, symptomatic PVCs, palpitations.  Symptomatic PVC's: With recent increase in her palpitation symptoms, she would like to be reevaluated.  Recommend repeat cardiac monitor and exercise treadmill stress test she would like to avoid additional medications at this time.  Hypertension: Controlled  Further recommendations after above test.  Thank you for referring the patient to [place]. Please feel free to contact with any questions.   Nigel Mormon, MD Pager: [phone] Office: [phone]

## 2020-02-01 ENCOUNTER — Ambulatory Visit: Payer: 59 | Admitting: Cardiology

## 2020-02-01 ENCOUNTER — Encounter: Payer: Self-pay | Admitting: Cardiology

## 2020-02-01 ENCOUNTER — Other Ambulatory Visit: Payer: Self-pay

## 2020-02-01 VITALS — BP 135/89 | HR 102 | Resp 16 | Ht 67.0 in | Wt 170.0 lb

## 2020-02-01 DIAGNOSIS — I493 Ventricular premature depolarization: Secondary | ICD-10-CM

## 2020-02-01 DIAGNOSIS — I10 Essential (primary) hypertension: Secondary | ICD-10-CM

## 2020-02-03 ENCOUNTER — Encounter: Payer: Self-pay | Admitting: Cardiology

## 2020-02-03 DIAGNOSIS — I493 Ventricular premature depolarization: Secondary | ICD-10-CM | POA: Insufficient documentation

## 2020-02-03 DIAGNOSIS — I10 Essential (primary) hypertension: Secondary | ICD-10-CM | POA: Insufficient documentation

## 2020-02-04 ENCOUNTER — Other Ambulatory Visit (HOSPITAL_COMMUNITY)
Admission: RE | Admit: 2020-02-04 | Discharge: 2020-02-04 | Disposition: A | Discharge status: Home / Self Care | Payer: 59 | Source: Ambulatory Visit | Attending: Cardiology | Admitting: Cardiology

## 2020-02-04 DIAGNOSIS — Z20822 Contact with and (suspected) exposure to covid-19: Secondary | ICD-10-CM | POA: Insufficient documentation

## 2020-02-04 DIAGNOSIS — Z01812 Encounter for preprocedural laboratory examination: Secondary | ICD-10-CM | POA: Diagnosis not present

## 2020-02-04 LAB — SARS CORONAVIRUS 2 (TAT 6-24 HRS): SARS Coronavirus 2: NEGATIVE

## 2020-02-06 ENCOUNTER — Ambulatory Visit: Payer: 59

## 2020-02-06 ENCOUNTER — Other Ambulatory Visit: Payer: Self-pay

## 2020-02-06 ENCOUNTER — Inpatient Hospital Stay: Payer: 59

## 2020-02-06 DIAGNOSIS — I493 Ventricular premature depolarization: Secondary | ICD-10-CM

## 2020-02-06 DIAGNOSIS — R002 Palpitations: Secondary | ICD-10-CM

## 2020-03-02 NOTE — Progress Notes (Signed)
Symptomatic PVC's noted. Stress test with low exercise capacity, but no ischemia., similar to stress test in 2016. Echocardiogram then was normal. I suspect dyspnea is due to deconditioning. Patient wants to hold off beta blocker therapy at this time. I will see her as needed.   Nigel Mormon, MD Pager: [phone] Office: [phone]

## 2021-12-15 IMAGING — CR DG CHEST 2V
2 series · 2 of 2 positions shown · non-contrast
Comparison: 05/25/2017.

CLINICAL DATA: Dry cough for the past 2-3 months.  Night sweats.

EXAM:
CHEST - 2 VIEW

[w chest pa]
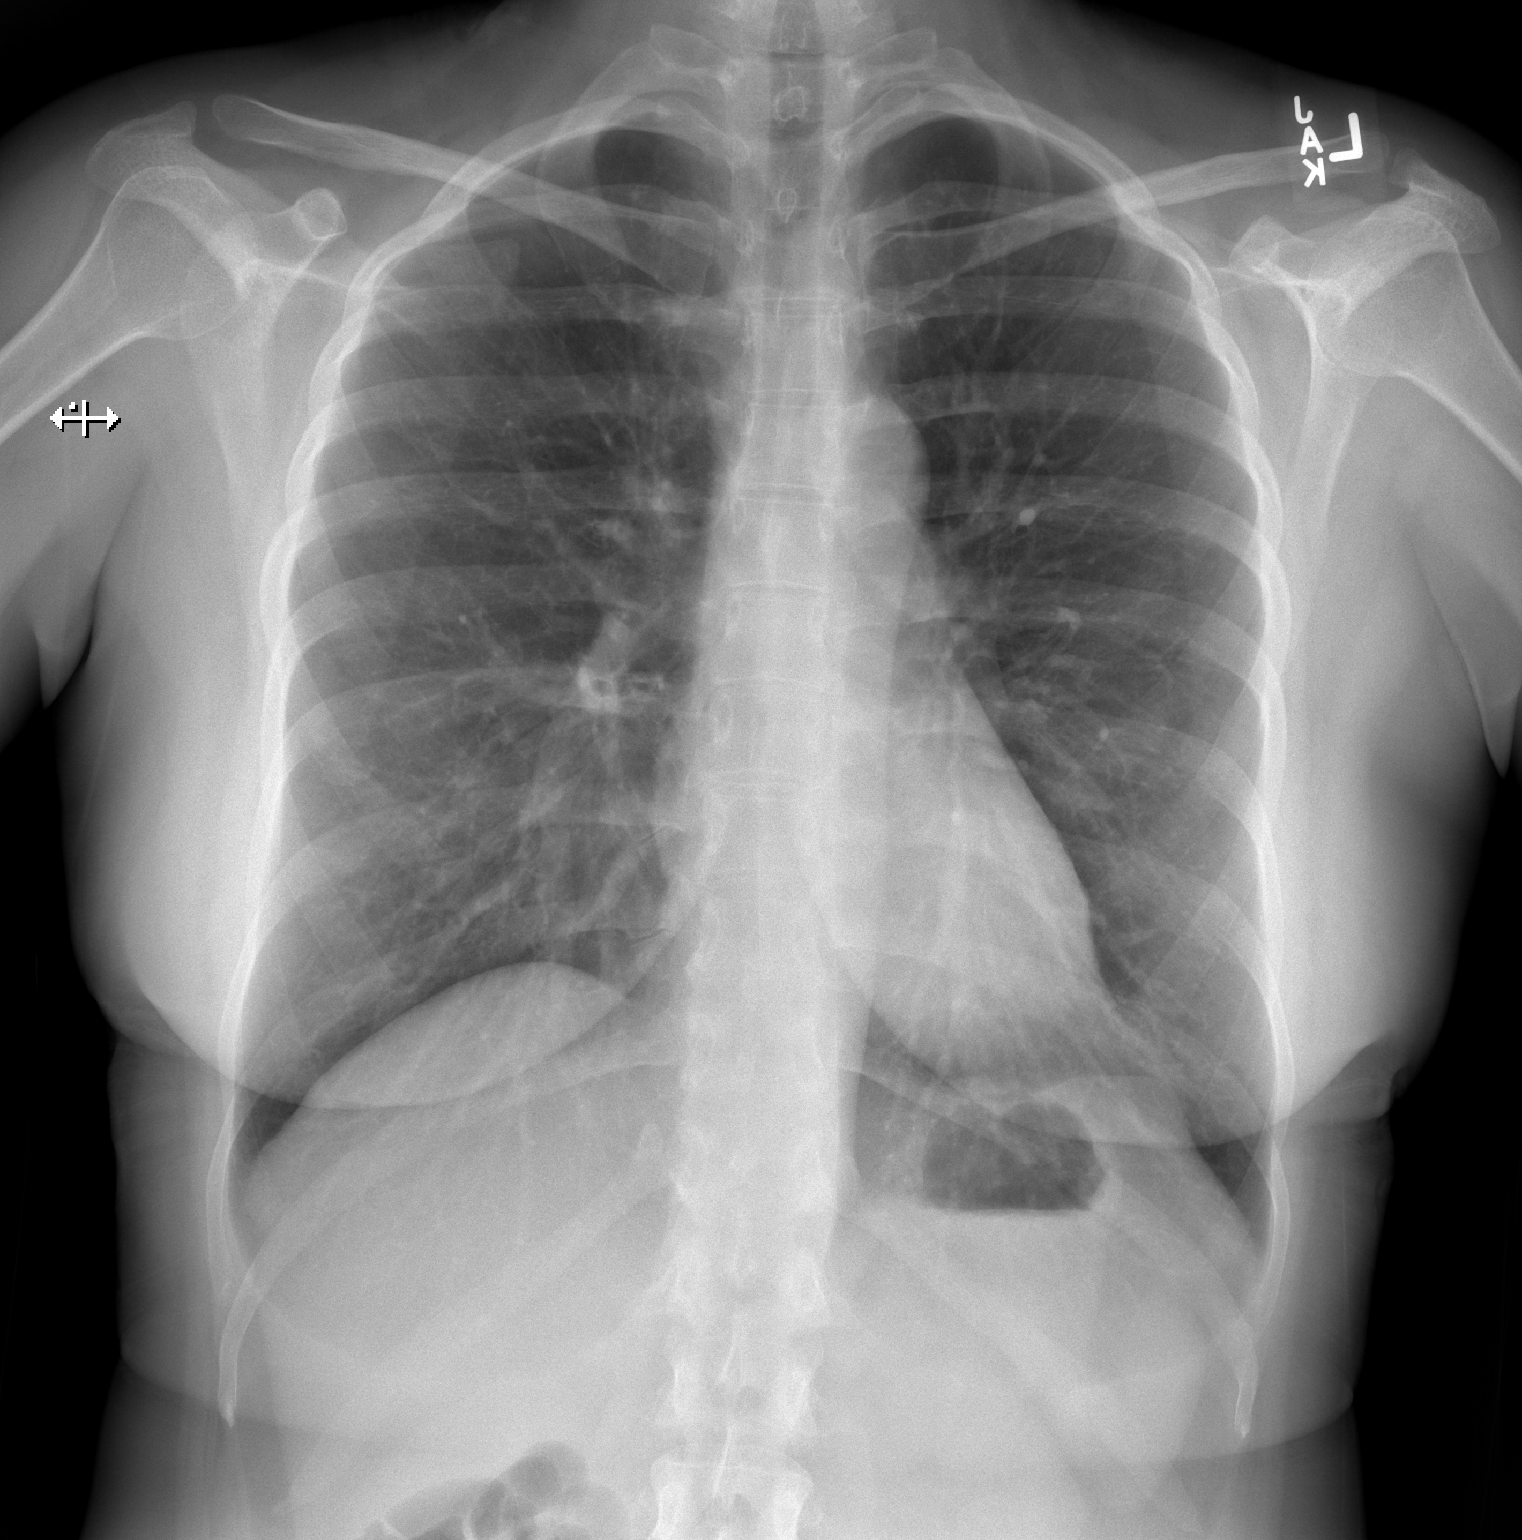

[w chest lat]
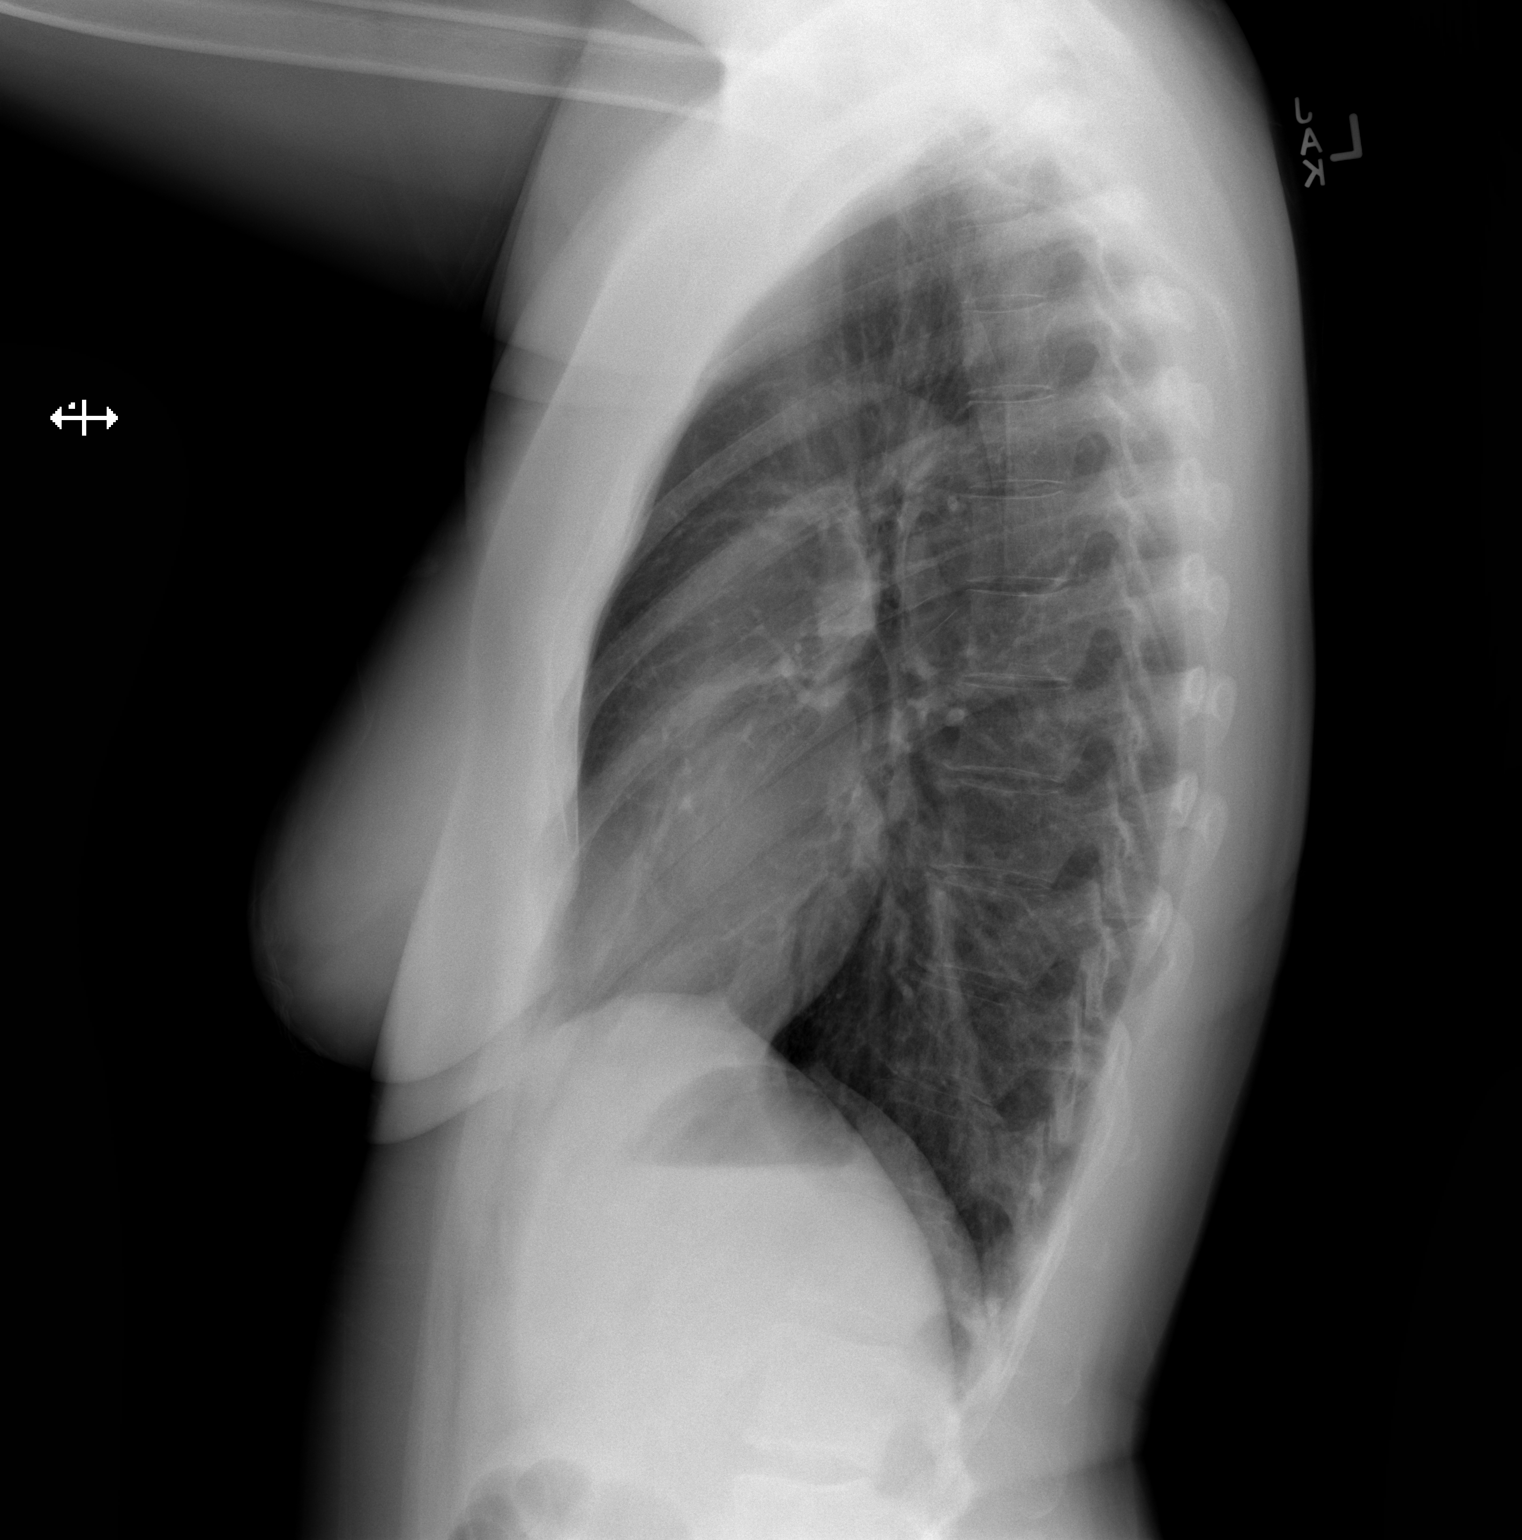

[2 of 2 positions shown; findings below may reference images not displayed]

FINDINGS: The heart size and mediastinal contours are within normal limits.
Both lungs are clear. The visualized skeletal structures are
unremarkable.
IMPRESSION: Normal examination.

## 2023-09-25 ENCOUNTER — Other Ambulatory Visit: Payer: Self-pay | Admitting: Internal Medicine

## 2023-09-25 ENCOUNTER — Ambulatory Visit
Admission: RE | Admit: 2023-09-25 | Discharge: 2023-09-25 | Disposition: A | Discharge status: Home / Self Care | Source: Ambulatory Visit | Attending: Internal Medicine | Admitting: Internal Medicine

## 2023-09-25 DIAGNOSIS — R053 Chronic cough: Secondary | ICD-10-CM

## 2023-11-11 NOTE — Progress Notes (Signed)
 Procedure Note - Removal of Cerumen Impaction, Bilateral:  Cc: 7-week return visit for ear cleaning.   Risks/benefits and alternatives were discussed with patient who understands and agrees to proceed.  DETAILS OF PROCEDURE: The patient was positioned and the ear was examined with the microscope. Obstructing cerumen was gently removed using suction from both ear canals. Tms fully visualized, normal and intact, middle ears aerated. No signs of infection or cholesteatoma. Hearing normalized in both ears per patient.   The patient tolerated this well.  No complications.  Follow up: 2 months.

## 2023-11-12 ENCOUNTER — Ambulatory Visit: Admitting: Pulmonary Disease

## 2023-11-12 ENCOUNTER — Encounter: Payer: Self-pay | Admitting: Pulmonary Disease

## 2023-11-12 VITALS — BP 136/80 | HR 85 | Temp 98.6°F | Ht 67.0 in | Wt 172.0 lb

## 2023-11-12 DIAGNOSIS — G4733 Obstructive sleep apnea (adult) (pediatric): Secondary | ICD-10-CM

## 2023-11-12 DIAGNOSIS — R053 Chronic cough: Secondary | ICD-10-CM

## 2023-11-12 NOTE — Progress Notes (Signed)
 Gabriela Moses    [id number]    1985-02-18  Primary Care Physician:Moreira, Gaither, MD  Referring Physician: Valma Gaither, MD [address] [place],  [place] [address]  Chief complaint: Consult for sleep apnea, CPAP intolerance  HPI: 38 y.o. who  has a past medical history of Anxiety, Arthritis, Chronic cough, Depression, Hypertension, Irritable bowel syndrome (IBS), OCD (obsessive compulsive disorder), PVC (premature ventricular contraction), UC (ulcerative colitis) ([facility]), and UTI (urinary tract infection) during pregnancy.  Discussed the use of AI scribe software for clinical note transcription with the patient, who gave verbal consent to proceed.  History of Present Illness Gabriela Moses is a 38 year old female with mild sleep apnea who presents with concerns about her CPAP machine and persistent symptoms. She is accompanied by her mother, [name].  Obstructive sleep apnea symptoms and cpap use - Diagnosed with mild sleep apnea in 2016 with a home sleep study showing an AHI of 6 - Uses auto CPAP machine with pressure setting of 5 to 8 - Previously seen by Dr. Gatha Campanile at the age is at [facility] - Unable to adjust CPAP settings due to lack of follow-up since the COVID-19 pandemic - Experiences excessive air intake with CPAP use - Concerned about the cleanliness of the CPAP machine - Continues to experience snoring - No observed apneic episodes by her husband - No significant weight changes - Informed of a very small airway, potentially contributing to sleep apnea  Chronic cough and upper airway symptoms - Persistent, unproductive cough since 2000 - Recent chest x-ray is clear - Attributes cough to postnasal drip and sinus issues - Underwent sinus surgery in 2020 - CT scans show sinus fullness post-surgery - ENT is not concerned about current sinus findings  Relevant Pulmonary history: Pets: No pets 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  No mold, hot tub, Jacuzzi.  No feather pillows or comforters No h/o chemo/XRT/amiodarone/macrodantin/MTX  No exposure to asbestos, silica or other organic allergens  Smoking history: Never smoker Travel history: No significant travel history Family history: No family history of lung disease   Outpatient Encounter Medications as of 11/12/2023  Medication Sig   amLODipine (NORVASC) 5 MG tablet Take 5 mg by mouth daily.   fluticasone (FLONASE) 50 MCG/ACT nasal spray 2 sprays.   omeprazole (PRILOSEC) 40 MG capsule Take 40 mg by mouth.   sertraline  (ZOLOFT ) 100 MG tablet Take 200 mg by mouth daily.   HYDROcodone -acetaminophen  (NORCO) 7.5-325 MG tablet Take 1 tablet by mouth every 6 (six) hours as needed for moderate pain. (Patient not taking: Reported on 11/12/2023)   mesalamine (LIALDA) 1.2 g EC tablet Take 4.8 g by mouth daily with breakfast. (Patient not taking: Reported on 11/12/2023)   REXULTI 1 MG TABS tablet Take 1 mg by mouth daily. (Patient not taking: Reported on 11/12/2023)   No facility-administered encounter medications on file as of 11/12/2023.     Physical Exam: Today's Vitals   11/12/23 0854  BP: 136/80  Pulse: 85  Temp: 98.6 F (37 C)  TempSrc: Oral  SpO2: 96%  Weight: 172 lb (78 kg)  Height: 5' 7 (1.702 m)   Body mass index is 26.94 kg/m.  Physical Exam GEN: No acute distress. CV: Regular rate and rhythm, no murmurs. LUNGS: Clear to auscultation bilaterally, normal respiratory effort. SKIN JOINTS: Warm and dry, no rash.    Data Reviewed: Imaging: Chest x-ray 09/25/2023-no  active cardiopulmonary disease.  I have reviewed the images personally.  PFTs:  Labs:  Assessment & Plan Obstructive sleep apnea, mild Mild obstructive sleep apnea diagnosed in 2016 with an AHI of 6. Symptoms include snoring and possible apneic episodes. CPAP was previously used but not reassessed since 2018. Concerns about CPAP  hygiene and potential bacterial exposure. Weight remains within a reasonable range. Discussed alternative treatments including dental devices and Inspire implant. - Order home sleep study - Schedule follow-up in 3 months with sleep team to review sleep study results and discuss treatment options  Persistent cough Chronic unproductive cough present since 2000. Recent chest x-ray was clear. Likely related to postnasal drip and GERD, both present. Sinus issues with previous sinus surgery in 2020 and ongoing postnasal drip. GERD is managed with current treatment. - Discuss postnasal drip management with primary care provider, including potential use of steroid spray or antihistamine  Recommendations: Home sleep study Follow-up in sleep clinic  Lonna Coder MD [facility] 11/12/2023, 9:26 AM  CC: Valma Carwin, MD

## 2023-11-12 NOTE — Patient Instructions (Signed)
  VISIT SUMMARY: During your visit, we discussed your concerns about your CPAP machine and persistent cough. We reviewed your history of mild sleep apnea and chronic cough, and we have planned further evaluations and follow-ups to address these issues.  YOUR PLAN: OBSTRUCTIVE SLEEP APNEA, MILD: You have mild obstructive sleep apnea diagnosed in 2016, and you are experiencing issues with your CPAP machine, including excessive air intake and concerns about cleanliness. -We will order a home sleep study to reassess your condition. -Please schedule a follow-up appointment with the sleep team in 3 months to review the sleep study results and discuss treatment options, including dental devices or the Inspire 2 implant.  PERSISTENT COUGH: You have had a chronic unproductive cough since 2000, likely related to postnasal drip and GERD. Your recent chest x-ray was clear, and you have a history of sinus issues and surgery. -Discuss postnasal drip management with your primary care provider. This may include the use of a steroid spray or antihistamine.                             Contains text generated by [employer].

## 2023-11-25 ENCOUNTER — Encounter

## 2023-11-25 DIAGNOSIS — G4733 Obstructive sleep apnea (adult) (pediatric): Secondary | ICD-10-CM

## 2023-12-04 DIAGNOSIS — R069 Unspecified abnormalities of breathing: Secondary | ICD-10-CM | POA: Diagnosis not present

## 2023-12-09 ENCOUNTER — Ambulatory Visit: Payer: Self-pay | Admitting: Pulmonary Disease

## 2024-01-31 ENCOUNTER — Emergency Department (HOSPITAL_BASED_OUTPATIENT_CLINIC_OR_DEPARTMENT_OTHER)
Admission: EM | Admit: 2024-01-31 | Discharge: 2024-01-31 | Disposition: A | Discharge status: Home / Self Care | Attending: Emergency Medicine | Admitting: Emergency Medicine

## 2024-01-31 DIAGNOSIS — J069 Acute upper respiratory infection, unspecified: Secondary | ICD-10-CM | POA: Insufficient documentation

## 2024-01-31 DIAGNOSIS — E876 Hypokalemia: Secondary | ICD-10-CM | POA: Insufficient documentation

## 2024-01-31 DIAGNOSIS — B029 Zoster without complications: Secondary | ICD-10-CM

## 2024-01-31 DIAGNOSIS — R519 Headache, unspecified: Secondary | ICD-10-CM | POA: Insufficient documentation

## 2024-01-31 DIAGNOSIS — E860 Dehydration: Secondary | ICD-10-CM | POA: Diagnosis present

## 2024-01-31 LAB — CBC WITH DIFFERENTIAL/PLATELET
Abs Immature Granulocytes: 0.04 K/uL (ref 0.00–0.07)
Basophils Absolute: 0 K/uL (ref 0.0–0.1)
Basophils Relative: 1 %
Eosinophils Absolute: 0.1 K/uL (ref 0.0–0.5)
Eosinophils Relative: 1 %
HCT: 38.4 % (ref 36.0–46.0)
Hemoglobin: 13.1 g/dL (ref 12.0–15.0)
Immature Granulocytes: 1 %
Lymphocytes Relative: 24 %
Lymphs Abs: 1.9 K/uL (ref 0.7–4.0)
MCH: 27.6 pg (ref 26.0–34.0)
MCHC: 34.1 g/dL (ref 30.0–36.0)
MCV: 81 fL (ref 80.0–100.0)
Monocytes Absolute: 0.8 K/uL (ref 0.1–1.0)
Monocytes Relative: 10 %
Neutro Abs: 5.1 K/uL (ref 1.7–7.7)
Neutrophils Relative %: 63 %
Platelets: 260 K/uL (ref 150–400)
RBC: 4.74 MIL/uL (ref 3.87–5.11)
RDW: 12.9 % (ref 11.5–15.5)
WBC: 8 K/uL (ref 4.0–10.5)
nRBC: 0 % (ref 0.0–0.2)

## 2024-01-31 LAB — MAGNESIUM: Magnesium: 2.3 mg/dL (ref 1.7–2.4)

## 2024-01-31 LAB — BASIC METABOLIC PANEL WITH GFR
Anion gap: 12 (ref 5–15)
BUN: 11 mg/dL (ref 6–20)
CO2: 29 mmol/L (ref 22–32)
Calcium: 9.8 mg/dL (ref 8.9–10.3)
Chloride: 98 mmol/L (ref 98–111)
Creatinine, Ser: 0.66 mg/dL (ref 0.44–1.00)
GFR, Estimated: >60 mL/min (ref >60)
Glucose, Bld: 104 mg/dL — ABNORMAL HIGH (ref 70–99)
Potassium: 2.6 mmol/L — CL (ref 3.5–5.1)
Sodium: 139 mmol/L (ref 135–145)

## 2024-01-31 LAB — CBG MONITORING, ED: Glucose-Capillary: 123 mg/dL — ABNORMAL HIGH (ref 70–99)

## 2024-01-31 LAB — POTASSIUM: Potassium: 3.1 mmol/L — ABNORMAL LOW (ref 3.5–5.1)

## 2024-01-31 MED ORDER — LIDOCAINE 5 % EX PTCH
1.0000 | MEDICATED_PATCH | CUTANEOUS | Status: DC
  Administered 2024-01-31: 1 via TRANSDERMAL
  Filled 2024-01-31: qty 1

## 2024-01-31 MED ORDER — LIDOCAINE 5 % EX PTCH: 1.0000 | MEDICATED_PATCH | CUTANEOUS | 0 refills | Status: AC

## 2024-01-31 MED ORDER — SODIUM CHLORIDE 0.9 % IV BOLUS
1000.0000 mL | Freq: Once | INTRAVENOUS | Status: AC
  Administered 2024-01-31: 1000 mL via INTRAVENOUS

## 2024-01-31 MED ORDER — VALACYCLOVIR HCL 1 G PO TABS: 1000.0000 mg | ORAL_TABLET | Freq: Three times a day (TID) | ORAL | 0 refills | Status: AC

## 2024-01-31 MED ORDER — POTASSIUM CHLORIDE 10 MEQ/100ML IV SOLN
10.0000 meq | INTRAVENOUS | Status: DC
  Administered 2024-01-31 (×4): 10 meq via INTRAVENOUS
  Filled 2024-01-31 (×4): qty 100

## 2024-01-31 MED ORDER — POTASSIUM CHLORIDE CRYS ER 20 MEQ PO TBCR
40.0000 meq | EXTENDED_RELEASE_TABLET | Freq: Once | ORAL | Status: AC
  Administered 2024-01-31: 40 meq via ORAL
  Filled 2024-01-31: qty 2

## 2024-01-31 MED ORDER — POTASSIUM CHLORIDE CRYS ER 20 MEQ PO TBCR: 20.0000 meq | EXTENDED_RELEASE_TABLET | Freq: Every day | ORAL | 0 refills | Status: AC

## 2024-01-31 NOTE — ED Provider Notes (Signed)
°  Physical Exam  BP (!) 145/76   Pulse 87   Temp 98.7 F (37.1 C) (Oral)   Resp 16   SpO2 100%   Physical Exam  Procedures  Procedures  ED Course / MDM   Clinical Course as of 01/31/24 2053  [place] Jan 31, 2024  1542 Magnesium : 2.3 [JL]  2053 Potassium(!): 3.1 [JL]  2053 Potassium(!!): 2.6 [JL]    Clinical Course User Index [JL] Jerrol Agent, MD   Medical Decision Making Amount and/or Complexity of Data Reviewed Labs: ordered. Decision-making details documented in ED Course.  Risk Prescription drug management.   89F presenting to the ER with URI symptoms, slightly dehydrated, awaiting labs, getting IVF. Likely DC after.   BMP: BMP with hypokalemia to 2.6.  Potassium supplementation ordered IV and orally, magnesium  collected and was normal.   Pt does state that she recently had an increase in her dosage of her home indapamide and additionally has had some loose stools with NBNB emesis over the past few days.  This combination is likely contributed to her hypokalemia presentation today.  Informed the patient plan for oral and IV potassium replenishment.  Patient endorsed a mildly erythematous rash across her chest wall, not particularly pruritic, not hives, no involvement of the mucous membranes.  Skin appears flushed on exam, blanches on palpation, not raised.  I was asked to evaluate the patient.  She endorses a burning and painful rash that is unilateral on her left back.  It is mildly erythematous, not raised, no vesicular component but due to the unilateral dermatomal nature and acute pain, in the setting of patient viral syndrome, consider early shingles.  Patient was provided with lidocaine  patch and will be discharged on oral Valtrex .  Patient repeat potassium was appropriately uptrending to 3.1.  Will discharge on potassium supplementation given her outpatient diuretic use.  Patient overall stable at this time on repeat assessment, stable for discharge and outpatient  follow-up with her PCP.       Jerrol Agent, MD 01/31/24 2053

## 2024-01-31 NOTE — ED Provider Notes (Addendum)
 [name] [facility] AT [facility] Provider Note   CSN: [id number] Arrival date & time: 01/31/24  1249     Patient presents with: Dehydration   Gabriela Moses is a 38 y.o. female.   38 yo F with a chief complaints of not feeling well.  Going on for a week or 2.  Started with a week of headaches that seem to come and go and then developed some posterior nasal drip and nausea decreased appetite fatigue.  At times feels very thirsty and feels like she cannot drink enough water.  She has been taking NyQuil for her cold symptoms.  Recently stopped her Zoloft  suddenly.  She thought maybe this was due to her stopping her medication and so started at a low dose.  Saw her family doctor who would started her on amoxicillin as well had her reinitiate her Zoloft  at higher dose than she was on but not back to normal.  She is also worried about swollen lymph node on the right side of the neck.  Has scheduled ultrasound by her PCP.  She is felt like she gets a rash at times.  Mostly on her face and hands.  Seems to also come and go.  She also feels that sometimes her back feels like it is burning.        Prior to Admission medications  Medication Sig Start Date End Date Taking? Authorizing Provider  amLODipine (NORVASC) 5 MG tablet Take 5 mg by mouth daily. 10/07/19   [provider]  fluticasone (FLONASE) 50 MCG/ACT nasal spray 2 sprays. 09/30/18   [provider]  HYDROcodone -acetaminophen  (NORCO) 7.5-325 MG tablet Take 1 tablet by mouth every 6 (six) hours as needed for moderate pain. Patient not taking: Reported on 11/12/2023 11/18/18   Jesus Oliphant, MD  mesalamine (LIALDA) 1.2 g EC tablet Take 4.8 g by mouth daily with breakfast. Patient not taking: Reported on 11/12/2023    [provider]  omeprazole (PRILOSEC) 40 MG capsule Take 40 mg by mouth. 04/28/23 04/27/24  [provider]  REXULTI 1 MG TABS tablet Take 1 mg by mouth daily. Patient  not taking: Reported on 11/12/2023 01/13/20   [provider]  sertraline  (ZOLOFT ) 100 MG tablet Take 200 mg by mouth daily.    [provider]    Allergies: Azithromycin, Benadryl  [diphenhydramine  hcl], Sulfa antibiotics, and Terconazole    Review of Systems  Updated Vital Signs BP (!) 167/101 (BP Location: Right Arm)   Pulse (!) 103   Temp 98.9 F (37.2 C) (Oral)   Resp 18   SpO2 99%   Physical Exam Vitals and nursing note reviewed.  Constitutional:      General: She is not in acute distress.    Appearance: She is well-developed. She is not diaphoretic.  HENT:     Head: Normocephalic and atraumatic.     Comments: Swollen turbinates, posterior nasal drip,  tm normal bilaterally.   Eyes:     Pupils: Pupils are equal, round, and reactive to light.  Cardiovascular:     Rate and Rhythm: Normal rate and regular rhythm.     Heart sounds: No murmur heard.    No friction rub. No gallop.  Pulmonary:     Effort: Pulmonary effort is normal.     Breath sounds: No wheezing or rales.  Abdominal:     General: There is no distension.     Palpations: Abdomen is soft.     Tenderness: There is no abdominal  tenderness.  Musculoskeletal:        General: No tenderness.     Cervical back: Normal range of motion and neck supple.  Skin:    General: Skin is warm and dry.  Neurological:     Mental Status: She is alert and oriented to person, place, and time.  Psychiatric:        Behavior: Behavior normal.     (all labs ordered are listed, but only abnormal results are displayed) Labs Reviewed  BASIC METABOLIC PANEL WITH GFR - Abnormal; Notable for the following components:      Result Value   Potassium 2.6 (*)    Glucose, Bld 104 (*)    All other components within normal limits  CBG MONITORING, ED - Abnormal; Notable for the following components:   Glucose-Capillary 123 (*)    All other components within normal limits  CBC WITH DIFFERENTIAL/PLATELET     EKG: None  Radiology: No results found.   Procedures   Medications Ordered in the ED  sodium chloride  0.9 % bolus 1,000 mL (1,000 mLs Intravenous New Bag/Given 01/31/24 1417)                                    Medical Decision Making Amount and/or Complexity of Data Reviewed Labs: ordered.   38 yo F with a cc of congestion fatigue.  This been going on for a week or 2.  She is well-appearing and nontoxic.  Appears well-hydrated.  There is some concern from the patient that she was dehydrated and felt very thirsty at times.  Will give a bag of IV fluids.  Check electrolytes.  Reassess.  Signed out to Dr. Jerrol, please see their note for further details of care in the ED.  The patients results and plan were reviewed and discussed.   Any x-rays performed were independently reviewed by myself.   Differential diagnosis were considered with the presenting HPI.  Medications  sodium chloride  0.9 % bolus 1,000 mL (1,000 mLs Intravenous New Bag/Given 01/31/24 1417)    Vitals:   01/31/24 1256  BP: (!) 167/101  Pulse: (!) 103  Resp: 18  Temp: 98.9 F (37.2 C)  TempSrc: Oral  SpO2: 99%    Final diagnoses:  Viral URI with cough        Final diagnoses:  Viral URI with cough    ED Discharge Orders     None        Emil Share, DO 01/31/24 1513

## 2024-01-31 NOTE — ED Triage Notes (Signed)
 Reports increased thirst, increased urinary frequency. States flu like symptoms. Family dx with URI.

## 2024-01-31 NOTE — Discharge Instructions (Addendum)
 Take tylenol  2 pills 4 times a day and motrin  4 pills 3 times a day.  Drink plenty of fluids.  Return for worsening shortness of breath, headache, confusion. Follow up with your family doctor.   Your potassium was low today and it is likely a combination of GI losses in addition to your home indapamide.  Potassium supplementation has been added as a prescription.  The rash on your low back as it is one-sided could be due to an early shingles outbreak for which we will treat with a course of Valtrex .  Please follow-up with your primary care provider for continued outpatient management.

## 2024-02-02 ENCOUNTER — Other Ambulatory Visit: Payer: Self-pay | Admitting: Physician Assistant

## 2024-02-02 DIAGNOSIS — R590 Localized enlarged lymph nodes: Secondary | ICD-10-CM

## 2024-02-05 ENCOUNTER — Encounter: Payer: Self-pay | Admitting: Pulmonary Disease

## 2024-02-05 ENCOUNTER — Ambulatory Visit: Admitting: Pulmonary Disease

## 2024-02-05 VITALS — BP 137/94 | HR 84 | Temp 98.1°F | Ht 67.0 in | Wt 165.6 lb

## 2024-02-05 DIAGNOSIS — I10 Essential (primary) hypertension: Secondary | ICD-10-CM | POA: Diagnosis not present

## 2024-02-05 DIAGNOSIS — G4733 Obstructive sleep apnea (adult) (pediatric): Secondary | ICD-10-CM | POA: Diagnosis not present

## 2024-02-05 NOTE — Patient Instructions (Addendum)
 Your most recent sleep study shows moderate sleep apnea with an event rate of 25.9 an hour, your oxygen stayed normal.  We will contact [employer] with an order for new machine  Auto CPAP 5-15 with heated humidification, nose pillows  Tentative follow-up in about 2 months  Call us  with any significant concerns  You can contact us  sooner than the appointment if the pressure is not tolerated

## 2024-02-05 NOTE — Progress Notes (Signed)
 "              Gabriela Moses    [id number]    [birth year]  Primary Care Physician:Moreira, Gaither, MD  Referring Physician: Valma Gaither, MD [address] [place],  [place] [address]  Chief complaint:   Patient being seen for obstructive sleep apnea  Discussed the use of AI scribe software for clinical note transcription with the patient, who gave verbal consent to proceed.  History of Present Illness Gabriela Moses is a 38 year old female with sleep apnea who presents for evaluation of her condition and CPAP use.  She has a history of sleep apnea for several years and was last evaluated with a sleep study in 2016. She uses a CPAP machine but not consistently every night due to its cumbersome nature and difficulty in cleaning. She experiences bloating from air entering her stomach, which she attributes to the CPAP pressure and her sleeping position. She uses a nasal mask with the CPAP.  She experiences acid reflux and sometimes wakes up in the middle of the night to use the bathroom. She wakes up feeling rested about four to five days a week and occasionally experiences morning headaches. She has experienced night sweats in the past, but not frequently.  She has been on Zoloft  for several years, taking it in the morning. She engages in regular physical activity, primarily walking, and describes herself as moderately active.   Sleep apnea was diagnosed about 2016 Has been using CPAP since then Current machine is dysfunctional  Not feeling like it is working well  Wakes up bloated   Outpatient Encounter Medications as of 02/05/2024  Medication Sig   fluticasone (FLONASE) 50 MCG/ACT nasal spray 2 sprays.   lidocaine  (LIDODERM ) 5 % Place 1 patch onto the skin daily. Remove & Discard patch within 12 hours or as directed by MD   omeprazole (PRILOSEC) 40 MG capsule Take 40 mg by mouth.   potassium chloride  SA (KLOR-CON  M) 20 MEQ tablet Take 1 tablet (20 mEq total) by mouth daily.    sertraline  (ZOLOFT ) 100 MG tablet Take 200 mg by mouth daily.   spironolactone (ALDACTONE) 25 MG tablet Take 25 mg by mouth daily.   valACYclovir  (VALTREX ) 1000 MG tablet Take 1 tablet (1,000 mg total) by mouth 3 (three) times daily.   [DISCONTINUED] amLODipine (NORVASC) 5 MG tablet Take 5 mg by mouth daily.   [DISCONTINUED] HYDROcodone -acetaminophen  (NORCO) 7.5-325 MG tablet Take 1 tablet by mouth every 6 (six) hours as needed for moderate pain. (Patient not taking: Reported on 11/12/2023)   [DISCONTINUED] mesalamine (LIALDA) 1.2 g EC tablet Take 4.8 g by mouth daily with breakfast. (Patient not taking: Reported on 11/12/2023)   [DISCONTINUED] REXULTI 1 MG TABS tablet Take 1 mg by mouth daily. (Patient not taking: Reported on 11/12/2023)   No facility-administered encounter medications on file as of 02/05/2024.    Allergies as of 02/05/2024 - Review Complete 02/05/2024  Allergen Reaction Noted   Azithromycin  01/27/2015   Benadryl  [diphenhydramine  hcl] Rash 10/27/2011   Sulfa antibiotics Rash 10/27/2011   Terconazole Rash and Other (See Comments) 01/31/2012    Past Medical History:  Diagnosis Date   Anxiety    Arthritis    Chronic cough    Depression    Hypertension    Irritable bowel syndrome (IBS)    OCD (obsessive compulsive disorder)    PVC (premature ventricular contraction)    UC (ulcerative colitis) (HCC)    UTI (urinary tract infection) during  pregnancy     Past Surgical History:  Procedure Laterality Date   ETHMOIDECTOMY Bilateral 11/18/2018   Procedure: Bilateral Endoscopic Ethmoid;  Surgeon: Jesus Oliphant, MD;  Location: [facility];  Service: ENT;  Laterality: Bilateral;   NASAL SINUS SURGERY Bilateral 11/18/2018   Procedure: Maxillary sinus surgery;  Surgeon: Jesus Oliphant, MD;  Location: [facility];  Service: ENT;  Laterality: Bilateral;   WISDOM TOOTH EXTRACTION      Family History  Problem Relation Age of Onset   Hypertension Mother     Hyperlipidemia Mother    Arthritis Maternal Grandmother    Hyperlipidemia Maternal Grandmother    Hypertension Maternal Grandmother    Breast cancer Neg Hx     Social History   Socioeconomic History   Marital status: Married    Spouse name: Not on file   Number of children: 1   Years of education: Not on file   Highest education level: Not on file  Occupational History   Occupation: stay at home mom    Employer: [employer]  Tobacco Use   Smoking status: Never   Smokeless tobacco: Never  Vaping Use   Vaping status: Never Used  Substance and Sexual Activity   Alcohol use: Not Currently    Comment: occ   Drug use: No   Sexual activity: Yes    Birth control/protection: None  Other Topics Concern   Not on file  Social History Narrative   Not on file   Social Drivers of Health   Tobacco Use: Low Risk (02/05/2024)   Patient History    Smoking Tobacco Use: Never    Smokeless Tobacco Use: Never    Passive Exposure: Not on file  Financial Resource Strain: Not on file  Food Insecurity: Low Risk (05/04/2023)   Received from [facility]   Epic    Within the past 12 months, you worried that your food would run out before you got money to buy more: Never true    Within the past 12 months, the food you bought just didn't last and you didn't have money to get more. : Never true  Transportation Needs: No Transportation Needs (05/04/2023)   Received from [facility]    In the past 12 months, has lack of reliable transportation kept you from medical appointments, meetings, work or from getting things needed for daily living? : No  Physical Activity: Not on file  Stress: Not on file  Social Connections: Not on file  Intimate Partner Violence: Not on file  Depression (EYV7-0): Not on file  Alcohol Screen: Not on file  Housing: Low Risk (05/04/2023)   Received from [facility]   Epic    What is your living situation today?: I have a steady place to live     Think about the place you live. Do you have problems with any of the following? Choose all that apply:: None/None on this list  Utilities: Low Risk (05/04/2023)   Received from [facility]   Utilities    In the past 12 months has the electric, gas, oil, or water company threatened to shut off services in your home? : No  Health Literacy: Not on file    Review of Systems  Respiratory:  Positive for apnea.   Psychiatric/Behavioral:  Positive for sleep disturbance.     Vitals:   02/05/24 0905  BP: (!) 137/94  Pulse: 84  Temp: 98.1 F (36.7 C)  SpO2: 97%  Physical Exam Constitutional:      Appearance: Normal appearance.  HENT:     Head: Normocephalic.     Nose: Nose normal.     Mouth/Throat:     Mouth: Mucous membranes are moist.  Eyes:     General: No scleral icterus. Cardiovascular:     Rate and Rhythm: Normal rate and regular rhythm.     Heart sounds: No murmur heard.    No friction rub.  Pulmonary:     Effort: No respiratory distress.     Breath sounds: No stridor. No wheezing or rhonchi.  Musculoskeletal:     Cervical back: No rigidity or tenderness.  Neurological:     General: No focal deficit present.     Mental Status: She is alert.  Psychiatric:        Mood and Affect: Mood normal.    Data Reviewed: Most recent sleep study 11/22/2023 shows AHI of 25.9, O2 nadir of 89%  Previous sleep study shows an AHI of 6  Download not available today  Assessment and Plan Assessment & Plan Obstructive sleep apnea 26 events per hour on recent home sleep study. Oxygen levels remain stable. Current CPAP machine is old and cumbersome, leading to inconsistent use. Reports bloating, possibly due to pressure settings or sleeping position. No significant issues with CPAP mask, but experiences occasional morning headaches and rare night sweats. Current CPAP use is inconsistent due to difficulty with cleaning and comfort. - Contacted medical supply company with new sleep  study results to order an auto CPAP machine. - Will adjust pressure settings based on download data after a few weeks of use to address bloating. - Will schedule follow-up appointment in 3-4 weeks to assess CPAP usage and effectiveness.  Tentative follow-up in about 2 months  Call us  with significant concerns  History of allergies - Controlled symptoms  Hypertension -  New CPAP order for auto CPAP 5-15 with heated humidification with nasal masks   Jennet Epley MD [facility] 02/05/2024, 9:33 AM  CC: Valma Carwin, MD   "

## 2024-02-13 ENCOUNTER — Emergency Department (HOSPITAL_BASED_OUTPATIENT_CLINIC_OR_DEPARTMENT_OTHER)
Admission: EM | Admit: 2024-02-13 | Discharge: 2024-02-13 | Disposition: A | Discharge status: Home / Self Care | Attending: Emergency Medicine | Admitting: Emergency Medicine

## 2024-02-13 ENCOUNTER — Encounter (HOSPITAL_BASED_OUTPATIENT_CLINIC_OR_DEPARTMENT_OTHER): Payer: Self-pay | Admitting: Emergency Medicine

## 2024-02-13 ENCOUNTER — Emergency Department (HOSPITAL_BASED_OUTPATIENT_CLINIC_OR_DEPARTMENT_OTHER)

## 2024-02-13 DIAGNOSIS — R109 Unspecified abdominal pain: Secondary | ICD-10-CM

## 2024-02-13 DIAGNOSIS — R112 Nausea with vomiting, unspecified: Secondary | ICD-10-CM | POA: Diagnosis present

## 2024-02-13 DIAGNOSIS — R1032 Left lower quadrant pain: Secondary | ICD-10-CM | POA: Diagnosis not present

## 2024-02-13 DIAGNOSIS — R197 Diarrhea, unspecified: Secondary | ICD-10-CM | POA: Diagnosis not present

## 2024-02-13 LAB — URINALYSIS, ROUTINE W REFLEX MICROSCOPIC
Bacteria, UA: NONE SEEN
Bilirubin Urine: NEGATIVE
Glucose, UA: NEGATIVE mg/dL
Leukocytes,Ua: NEGATIVE
Nitrite: NEGATIVE
Protein, ur: 30 mg/dL — AB
Specific Gravity, Urine: 1.023 (ref 1.005–1.030)
pH: 7 (ref 5.0–8.0)

## 2024-02-13 LAB — CBC
HCT: 39.5 % (ref 36.0–46.0)
Hemoglobin: 13.3 g/dL (ref 12.0–15.0)
MCH: 27.9 pg (ref 26.0–34.0)
MCHC: 33.7 g/dL (ref 30.0–36.0)
MCV: 82.8 fL (ref 80.0–100.0)
Platelets: 225 K/uL (ref 150–400)
RBC: 4.77 MIL/uL (ref 3.87–5.11)
RDW: 13.6 % (ref 11.5–15.5)
WBC: 8.3 K/uL (ref 4.0–10.5)
nRBC: 0 % (ref 0.0–0.2)

## 2024-02-13 LAB — RESP PANEL BY RT-PCR (RSV, FLU A&B, COVID)  RVPGX2
Influenza A by PCR: NEGATIVE
Influenza B by PCR: NEGATIVE
Resp Syncytial Virus by PCR: NEGATIVE
SARS Coronavirus 2 by RT PCR: NEGATIVE

## 2024-02-13 LAB — COMPREHENSIVE METABOLIC PANEL WITH GFR
ALT: 17 U/L (ref 0–44)
AST: 18 U/L (ref 15–41)
Albumin: 4.3 g/dL (ref 3.5–5.0)
Alkaline Phosphatase: 67 U/L (ref 38–126)
Anion gap: 11 (ref 5–15)
BUN: 8 mg/dL (ref 6–20)
CO2: 23 mmol/L (ref 22–32)
Calcium: 9.7 mg/dL (ref 8.9–10.3)
Chloride: 103 mmol/L (ref 98–111)
Creatinine, Ser: 0.56 mg/dL (ref 0.44–1.00)
GFR, Estimated: >60 mL/min
Glucose, Bld: 166 mg/dL — ABNORMAL HIGH (ref 70–99)
Potassium: 3.6 mmol/L (ref 3.5–5.1)
Sodium: 137 mmol/L (ref 135–145)
Total Bilirubin: 0.4 mg/dL (ref 0.0–1.2)
Total Protein: 7.3 g/dL (ref 6.5–8.1)

## 2024-02-13 LAB — C-REACTIVE PROTEIN: CRP: <0.5 mg/dL

## 2024-02-13 LAB — PREGNANCY, URINE: Preg Test, Ur: NEGATIVE

## 2024-02-13 LAB — SEDIMENTATION RATE: Sed Rate: 13 mm/h (ref 0–22)

## 2024-02-13 LAB — LIPASE, BLOOD: Lipase: 28 U/L (ref 11–51)

## 2024-02-13 MED ORDER — IOHEXOL 300 MG/ML  SOLN
100.0000 mL | Freq: Once | INTRAMUSCULAR | Status: AC | PRN
  Administered 2024-02-13: 100 mL via INTRAVENOUS

## 2024-02-13 MED ORDER — LACTATED RINGERS IV BOLUS
1000.0000 mL | Freq: Once | INTRAVENOUS | Status: AC
  Administered 2024-02-13: 1000 mL via INTRAVENOUS

## 2024-02-13 MED ORDER — MORPHINE SULFATE (PF) 4 MG/ML IV SOLN
4.0000 mg | Freq: Once | INTRAVENOUS | Status: AC
  Administered 2024-02-13: 4 mg via INTRAVENOUS
  Filled 2024-02-13: qty 1

## 2024-02-13 MED ORDER — ONDANSETRON HCL 4 MG/2ML IJ SOLN
4.0000 mg | Freq: Once | INTRAMUSCULAR | Status: AC
  Administered 2024-02-13: 4 mg via INTRAVENOUS
  Filled 2024-02-13: qty 2

## 2024-02-13 MED ORDER — DICYCLOMINE HCL 10 MG PO CAPS
10.0000 mg | ORAL_CAPSULE | Freq: Once | ORAL | Status: AC
  Administered 2024-02-13: 10 mg via ORAL
  Filled 2024-02-13: qty 1

## 2024-02-13 MED ORDER — ONDANSETRON HCL 4 MG PO TABS: 4.0000 mg | ORAL_TABLET | Freq: Four times a day (QID) | ORAL | 0 refills | Status: AC

## 2024-02-13 MED ORDER — DICYCLOMINE HCL 20 MG PO TABS: 20.0000 mg | ORAL_TABLET | Freq: Two times a day (BID) | ORAL | 0 refills | Status: AC

## 2024-02-13 NOTE — ED Triage Notes (Signed)
 Left side abdo pain vomiting Diarrhea  Worse since this AM Symptoms x weeks Hx UC

## 2024-02-13 NOTE — ED Provider Notes (Signed)
 " [place] EMERGENCY DEPARTMENT AT [facility] Provider Note   CSN: [id number] Arrival date & time: 02/13/24  1020     Patient presents with: Abdominal Pain   Gabriela Moses is a 38 y.o. female.   39 year old female history of ulcerative colitis who presents to the emergency department with left-sided abdominal pain, nausea, vomiting, and diarrhea.  Patient reports that for the past month she has been having nausea vomiting diarrhea.  Typically worse in the morning and has 3-4 loose stools per day but then it resolves during the day.  They are oily and greasy.  Does have a sense of incomplete defecation and urgency.  No fevers or chills.  This morning started having worsening left-sided abdominal pain that radiates to her back.  Mild in severity.  Reports also having occasional nausea and vomiting daily as well.  Reports that she was placed on amoxicillin earlier in the month and had her blood pressure medication changed which she is wondering if this could be contributing.       Prior to Admission medications  Medication Sig Start Date End Date Taking? Authorizing Provider  dicyclomine  (BENTYL ) 20 MG tablet Take 1 tablet (20 mg total) by mouth 2 (two) times daily. 02/13/24  Yes Mannie Pac T, DO  ondansetron  (ZOFRAN ) 4 MG tablet Take 1 tablet (4 mg total) by mouth every 6 (six) hours. 02/13/24  Yes Mannie Pac T, DO  fluticasone (FLONASE) 50 MCG/ACT nasal spray 2 sprays. 09/30/18   [provider]  lidocaine  (LIDODERM ) 5 % Place 1 patch onto the skin daily. Remove & Discard patch within 12 hours or as directed by MD 01/31/24   Jerrol Agent, MD  omeprazole (PRILOSEC) 40 MG capsule Take 40 mg by mouth. 04/28/23 04/27/24  [provider]  potassium chloride  SA (KLOR-CON  M) 20 MEQ tablet Take 1 tablet (20 mEq total) by mouth daily. 01/31/24 03/01/24  Jerrol Agent, MD  sertraline  (ZOLOFT ) 100 MG tablet Take 200 mg by mouth daily.    [provider]  spironolactone (ALDACTONE) 25 MG tablet Take 25 mg by mouth daily. 02/02/24   [provider]  valACYclovir  (VALTREX ) 1000 MG tablet Take 1 tablet (1,000 mg total) by mouth 3 (three) times daily. 01/31/24   Jerrol Agent, MD    Allergies: Azithromycin, Benadryl  [diphenhydramine  hcl], Sulfa antibiotics, and Terconazole    Review of Systems  Updated Vital Signs BP (!) 152/90   Pulse 85   Temp 98.8 F (37.1 C) (Oral)   Resp 17   LMP  (LMP Unknown)   SpO2 98%   Physical Exam Constitutional:      Appearance: Normal appearance.  Abdominal:     General: There is no distension.     Palpations: There is no mass.     Tenderness: There is abdominal tenderness (Left mid abdomen and left lower quadrant). There is no right CVA tenderness, left CVA tenderness or guarding.  Neurological:     Mental Status: She is alert.     (all labs ordered are listed, but only abnormal results are displayed) Labs Reviewed  COMPREHENSIVE METABOLIC PANEL WITH GFR - Abnormal; Notable for the following components:      Result Value   Glucose, Bld 166 (*)    All other components within normal limits  URINALYSIS, ROUTINE W REFLEX MICROSCOPIC - Abnormal; Notable for the following components:   Hgb urine dipstick SMALL (*)    Ketones, ur TRACE (*)    Protein, ur 30 (*)  All other components within normal limits  RESP PANEL BY RT-PCR (RSV, FLU A&B, COVID)  RVPGX2  LIPASE, BLOOD  CBC  PREGNANCY, URINE  SEDIMENTATION RATE  C-REACTIVE PROTEIN    EKG: None  Radiology: CT ABDOMEN PELVIS W CONTRAST Result Date: 02/13/2024 CLINICAL DATA:  Abdominal pain with nausea, vomiting and diarrhea. EXAM: CT ABDOMEN AND PELVIS WITH CONTRAST TECHNIQUE: Multidetector CT imaging of the abdomen and pelvis was performed using the standard protocol following bolus administration of intravenous contrast. RADIATION DOSE REDUCTION: This exam was performed according to the departmental dose-optimization program  which includes automated exposure control, adjustment of the mA and/or kV according to patient size and/or use of iterative reconstruction technique. CONTRAST:  OMNIPAQUE  IOHEXOL  300 MG/ML  SOLN COMPARISON:  January 27, 2015 FINDINGS: Lower chest: No acute abnormality. Hepatobiliary: No focal liver abnormality is seen. No gallstones, gallbladder wall thickening, or biliary dilatation. Pancreas: Unremarkable. No pancreatic ductal dilatation or surrounding inflammatory changes. Spleen: Normal in size without focal abnormality. Adrenals/Urinary Tract: Adrenal glands are unremarkable. Kidneys are normal, without renal calculi, focal lesion, or hydronephrosis. Bladder is unremarkable. Stomach/Bowel: Stomach is within normal limits. Appendix appears normal. No evidence of bowel wall thickening, distention, or inflammatory changes. Vascular/Lymphatic: No significant vascular findings are present. No enlarged abdominal or pelvic lymph nodes. Reproductive: Uterus and bilateral adnexa are unremarkable. Other: No abdominal wall hernia or abnormality. A very small amount of posterior pelvic free fluid is seen. Musculoskeletal: No acute or significant osseous findings. IMPRESSION: 1. Very small amount of posterior pelvic free fluid, likely physiologic. 2. No other acute or active process within the abdomen or pelvis. Electronically Signed   By: Suzen Dials M.D.   On: 02/13/2024 15:37     Procedures   Medications Ordered in the ED  morphine  (PF) 4 MG/ML injection 4 mg (4 mg Intravenous Given 02/13/24 1403)  ondansetron  (ZOFRAN ) injection 4 mg (4 mg Intravenous Given 02/13/24 1403)  lactated ringers  bolus 1,000 mL (0 mLs Intravenous Stopped 02/13/24 1558)  iohexol  (OMNIPAQUE ) 300 MG/ML solution 100 mL (100 mLs Intravenous Contrast Given 02/13/24 1425)  dicyclomine  (BENTYL ) capsule 10 mg (10 mg Oral Given 02/13/24 1609)    Clinical Course as of 02/13/24 1811  Sat Feb 13, 2024  1527 Signed out to Dr  Mannie [RP]    Clinical Course User Index [RP] Yolande Lamar BROCKS, MD                                 Medical Decision Making Amount and/or Complexity of Data Reviewed Labs: ordered. Radiology: ordered.  Risk Prescription drug management.   Gabriela Moses is a 38 year old female history of ulcerative colitis who presents to the emergency department with left-sided abdominal pain, nausea, vomiting, and diarrhea.    Initial Ddx:  Ulcerative colitis flare, gastroenteritis, antibiotic side effect, C. difficile  MDM/Course:  Patient presents to the emergency department with left-sided abdominal pain as well as nausea, vomiting, diarrhea.  On exam does have left-sided abdominal tenderness palpation.  She is not in acute distress.  Vital signs reassuring.  ESR and CRP WNL.  Normal white blood cell count.  CMP unremarkable.  Was given morphine  and Zofran  and upon re-evaluation was feeling improved.  Signed out to the oncoming physician awaiting results of her CT scan.  If negative likely can be discharged home with GI and PCP follow-up  This patient presents to the ED for concern of complaints listed in  HPI, this involves an extensive number of treatment options, and is a complaint that carries with it a high risk of complications and morbidity. Disposition including potential need for admission considered.   Dispo: Pending remainder of workup  I have reviewed the patients home medications and made adjustments as needed Additional history obtained from mother Records reviewed Outpatient Clinic Notes The following labs were independently interpreted: Chemistry and show no acute abnormality  Portions of this note were generated with Dragon dictation software. Dictation errors may occur despite best attempts at proofreading.     Final diagnoses:  Abdominal pain, unspecified abdominal location    ED Discharge Orders          Ordered    ondansetron  (ZOFRAN ) 4 MG tablet  Every 6 hours         02/13/24 1604    dicyclomine  (BENTYL ) 20 MG tablet  2 times daily        02/13/24 1604               Yolande Lamar BROCKS, MD 02/13/24 1811  "

## 2024-02-13 NOTE — Discharge Instructions (Signed)
 While you were in the emergency room, your blood work done that overall was normal.  Your CT scan also was normal.  I sent your prescription for 2 medicines.  The first is Bentyl , which you can take 2 times per day for abdominal pain.  You have also take Zofran  as needed for nausea.  Please follow-up with your GI doctor.

## 2024-02-13 NOTE — ED Notes (Signed)
 Patient notified of need for urine sample to complete eval/assessment. Specimen cup provided and instructions for clean catch given. Patient will notify staff when able to provide

## 2024-02-13 NOTE — ED Provider Notes (Signed)
" °  Physical Exam  BP (!) 152/90   Pulse 85   Temp 98.8 F (37.1 C) (Oral)   Resp 17   LMP  (LMP Unknown)   SpO2 98%   Physical Exam  Procedures  Procedures  ED Course / MDM   Clinical Course as of 02/13/24 1602  Sat Feb 13, 2024  1527 Signed out to Dr Mannie [RP]    Clinical Course User Index [RP] Yolande Lamar BROCKS, MD   Medical Decision Making Amount and/or Complexity of Data Reviewed Labs: ordered. Radiology: ordered.  Risk Prescription drug management.   Ulcerative colitis history, signed out pending CT imaging which was negative.  Patient's sed rate nonelevated.  Unlikely to be severe UC flare.  Will discharge with Bentyl , Zofran .  She is following up with her GI doctor in 10 days.  Return precautions discussed at bedside.       Mannie Pac T, DO 02/13/24 1603  "

## 2024-02-15 ENCOUNTER — Ambulatory Visit
Admission: RE | Admit: 2024-02-15 | Discharge: 2024-02-15 | Disposition: A | Discharge status: Home / Self Care | Source: Ambulatory Visit | Attending: Physician Assistant | Admitting: Physician Assistant

## 2024-02-15 DIAGNOSIS — R590 Localized enlarged lymph nodes: Secondary | ICD-10-CM

## 2024-03-16 ENCOUNTER — Other Ambulatory Visit: Payer: Self-pay | Admitting: Physician Assistant

## 2024-03-16 ENCOUNTER — Other Ambulatory Visit (HOSPITAL_COMMUNITY): Payer: Self-pay | Admitting: Physician Assistant

## 2024-03-16 DIAGNOSIS — R590 Localized enlarged lymph nodes: Secondary | ICD-10-CM

## 2024-03-23 NOTE — Progress Notes (Unsigned)
 Gabriela Moses POUR, MD  Gabriela Moses L I tried to call but couldn't get through.    I don't recommend biopsy given the appearence of the nodes - the cortex is very thin - not only is this reassuring for a reactive node, but biopsy may under sample and be read by pathology as non-diagnostic.  I would suggest clinical surveillance and if the nodes enlarge over time, then repeat imaging and perhaps biopsy would be warranted.    If they still persist and really want to proceed with biopsy, then you can approve it without sedation and we'll give it a try.  HKM       Previous Messages    ----- Message ----- From: Gabriela [name] Sent: 03/23/2024   9:10 AM EST To: [name] Gabriela; Taryn F Rigney, RT; Ir Proc* Subject: US  FINE NEEDLE ASP 1ST LESION                  Procedure :US  FINE NEEDLE ASP 1ST LESION    Reason :Right posterior cervical lymphadenopathy Dx: Cervical lymphadenopathy [R59.0 (ICD-10-CM)]    History :US  SOFT TISSUE HEAD & NECK (NON-THYROID )  Provider: Benay Kay, PA-C  Provider contact ; [phone]

## 2024-04-07 ENCOUNTER — Ambulatory Visit: Admitting: Pulmonary Disease

## 2024-04-08 ENCOUNTER — Ambulatory Visit: Admitting: Pulmonary Disease

## 2024-04-19 ENCOUNTER — Ambulatory Visit: Admitting: Obstetrics and Gynecology
